# Patient Record
Sex: Female | Born: 1970 | Race: White | Hispanic: No | Marital: Single | Smoking: Current every day smoker
Health system: Southern US, Community
[De-identification: ages and names within clinical notes are randomized; demographics above are authoritative.]

## PROBLEM LIST (undated history)

## (undated) DIAGNOSIS — K219 Gastro-esophageal reflux disease without esophagitis: Secondary | ICD-10-CM

## (undated) DIAGNOSIS — N200 Calculus of kidney: Secondary | ICD-10-CM

## (undated) HISTORY — PX: PILONIDAL CYST EXCISION: SHX744

## (undated) HISTORY — PX: ABDOMINAL HYSTERECTOMY: SHX81

## (undated) HISTORY — PX: LITHOTRIPSY: SUR834

## (undated) HISTORY — PX: BREAST REDUCTION SURGERY: SHX8

---

## 2001-01-15 HISTORY — PX: ABDOMINAL HYSTERECTOMY: SHX81

## 2003-10-27 ENCOUNTER — Emergency Department: Payer: Self-pay | Admitting: General Practice

## 2003-11-23 ENCOUNTER — Emergency Department: Payer: Self-pay | Admitting: Emergency Medicine

## 2004-01-13 ENCOUNTER — Emergency Department: Payer: Self-pay | Admitting: Emergency Medicine

## 2004-02-24 ENCOUNTER — Emergency Department: Payer: Self-pay | Admitting: General Practice

## 2004-04-25 ENCOUNTER — Emergency Department: Payer: Self-pay | Admitting: Emergency Medicine

## 2004-11-21 ENCOUNTER — Emergency Department: Payer: Self-pay | Admitting: Unknown Physician Specialty

## 2005-06-06 ENCOUNTER — Other Ambulatory Visit: Payer: Self-pay

## 2005-06-06 ENCOUNTER — Emergency Department: Payer: Self-pay | Admitting: Emergency Medicine

## 2005-08-25 ENCOUNTER — Observation Stay: Payer: Self-pay | Admitting: Urology

## 2005-11-21 ENCOUNTER — Inpatient Hospital Stay: Payer: Self-pay | Admitting: Urology

## 2005-11-24 ENCOUNTER — Emergency Department: Payer: Self-pay | Admitting: General Practice

## 2005-11-29 ENCOUNTER — Inpatient Hospital Stay: Payer: Self-pay | Admitting: Internal Medicine

## 2006-01-27 ENCOUNTER — Emergency Department: Payer: Self-pay | Admitting: Emergency Medicine

## 2006-05-01 ENCOUNTER — Ambulatory Visit: Payer: Self-pay

## 2006-05-06 ENCOUNTER — Ambulatory Visit: Payer: Self-pay

## 2006-05-09 ENCOUNTER — Inpatient Hospital Stay: Payer: Self-pay

## 2006-10-16 ENCOUNTER — Emergency Department: Payer: Self-pay

## 2007-01-29 ENCOUNTER — Emergency Department: Payer: Self-pay | Admitting: Emergency Medicine

## 2007-01-31 ENCOUNTER — Emergency Department: Payer: Self-pay | Admitting: Emergency Medicine

## 2007-02-07 ENCOUNTER — Inpatient Hospital Stay: Payer: Self-pay | Admitting: Internal Medicine

## 2007-06-23 ENCOUNTER — Observation Stay: Payer: Self-pay | Admitting: Internal Medicine

## 2007-08-04 ENCOUNTER — Other Ambulatory Visit: Payer: Self-pay

## 2007-08-04 ENCOUNTER — Emergency Department: Payer: Self-pay | Admitting: Emergency Medicine

## 2007-08-13 ENCOUNTER — Emergency Department: Payer: Self-pay | Admitting: Emergency Medicine

## 2007-11-06 ENCOUNTER — Emergency Department: Payer: Self-pay | Admitting: Emergency Medicine

## 2008-01-10 IMAGING — CT CT ABD-PELV W/O CM
1 of 2 series · 16 of 32 positions shown, 20 images · non-contrast
Comparison: none

REASON FOR EXAM: Renal colic
COMMENTS:

[Series 2: stone · axial · 0.62mm/px · z∈[-164,+241]mm · 16 of 152 slices shown, 20 images]
[im 11/152  soft-tissue]
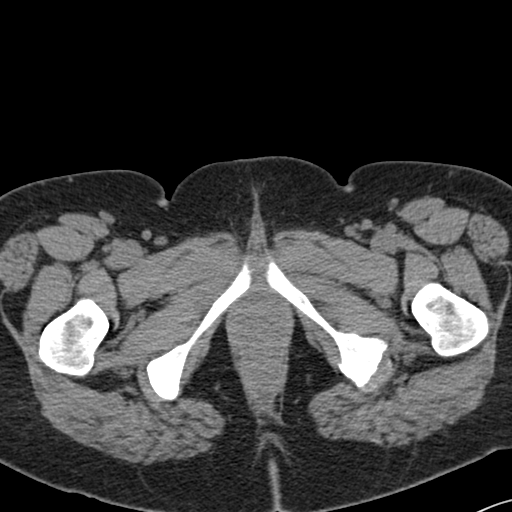
[im 11/152  bone]
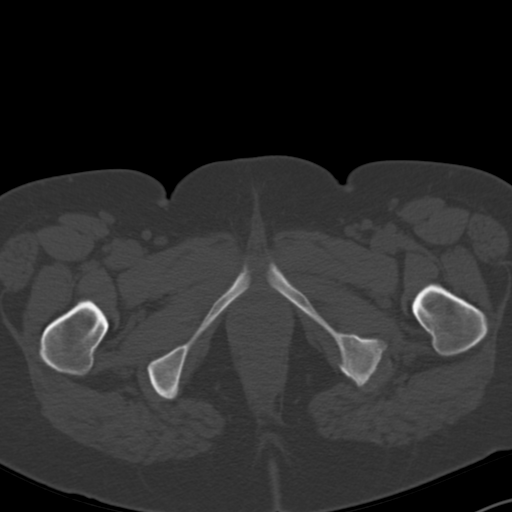
[im 21/152  soft-tissue]
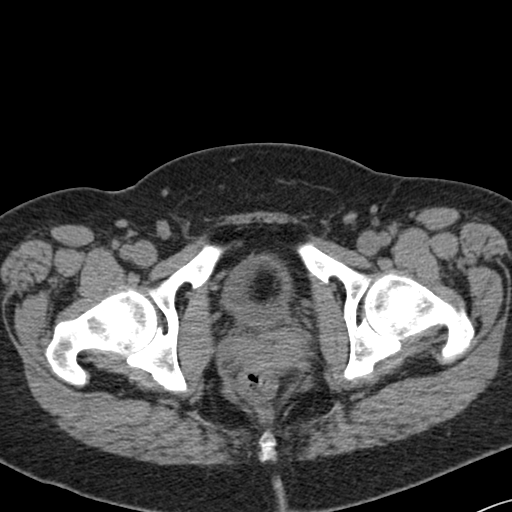
[im 32/152  soft-tissue]
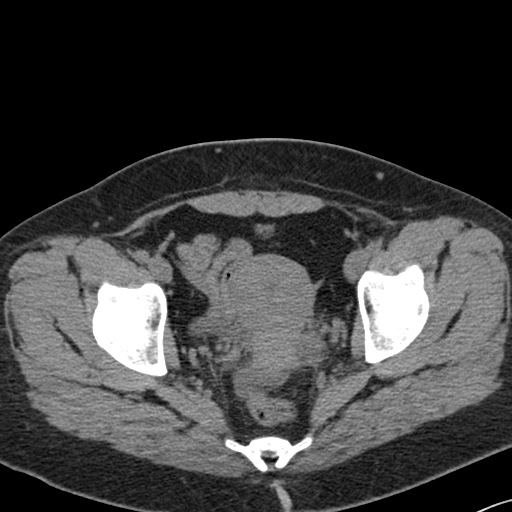
[im 42/152  soft-tissue]
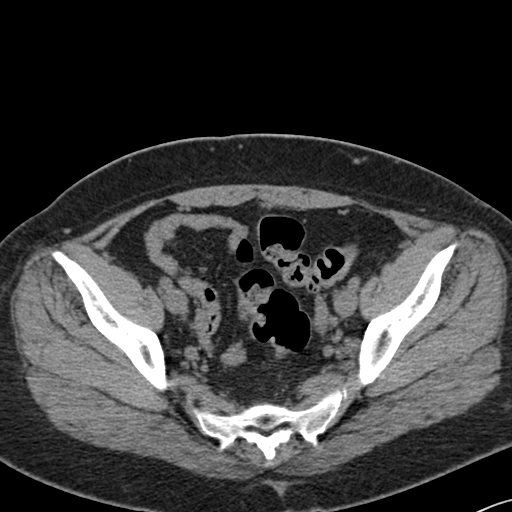
[im 53/152  soft-tissue]
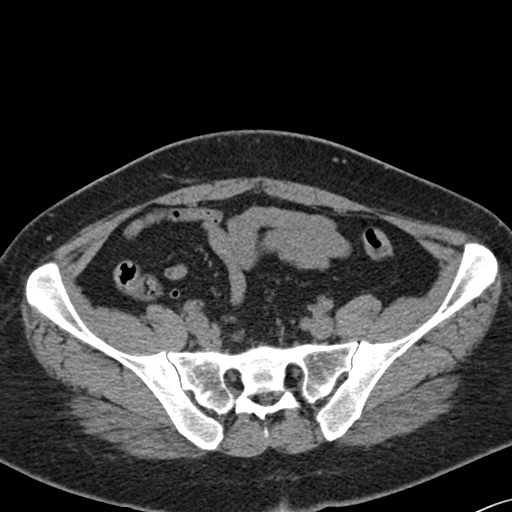
[im 63/152  soft-tissue]
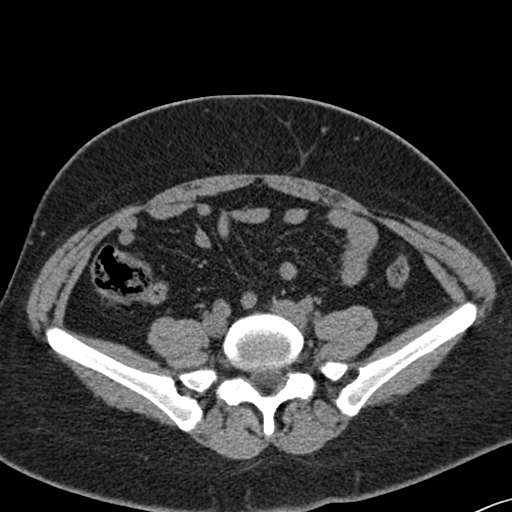
[im 73/152  soft-tissue]
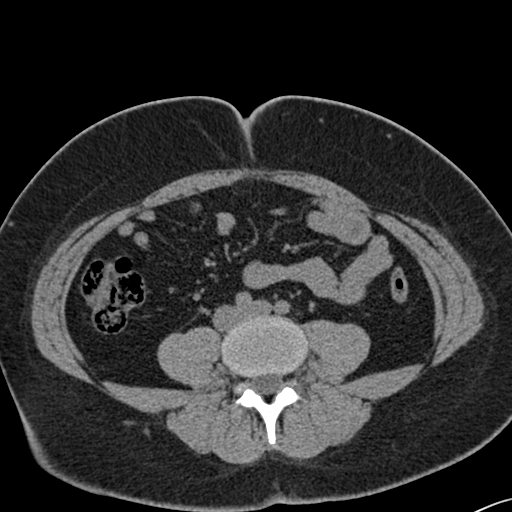
[im 84/152  soft-tissue]
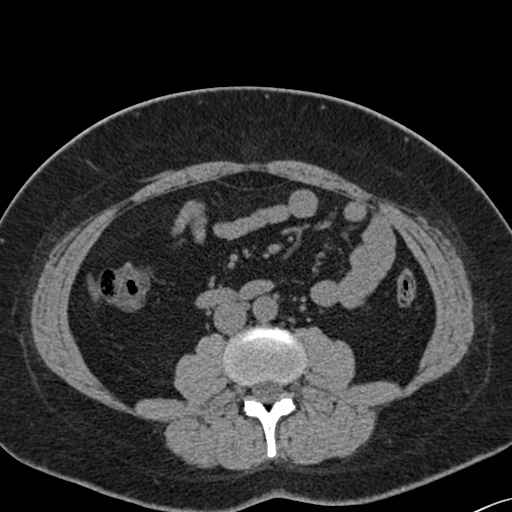
[im 94/152  soft-tissue]
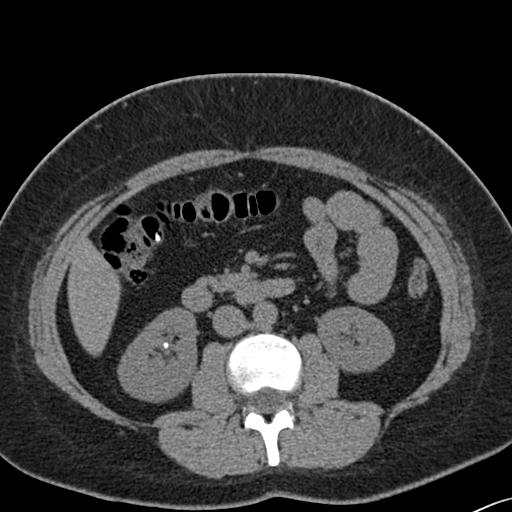
[im 94/152  bone]
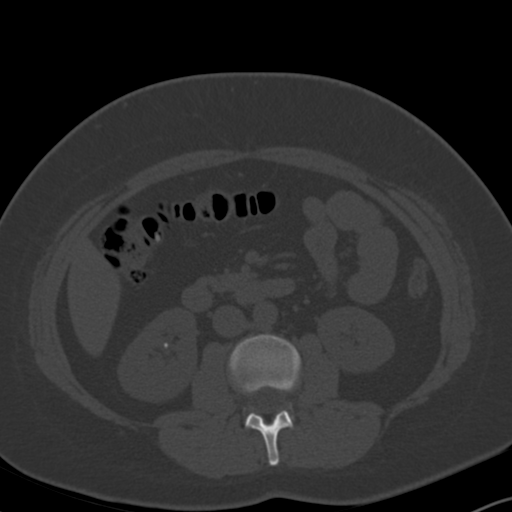
[im 105/152  soft-tissue]
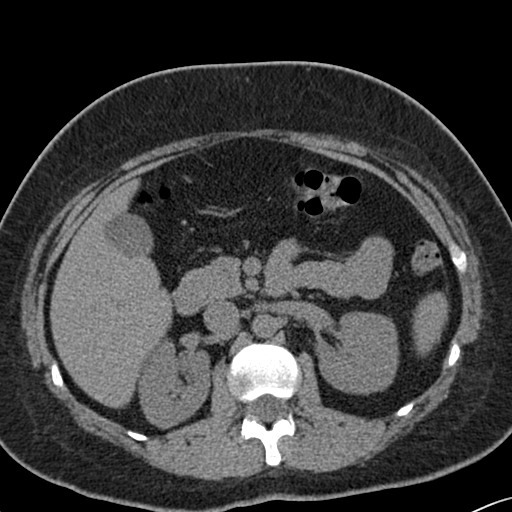
[im 115/152  soft-tissue]
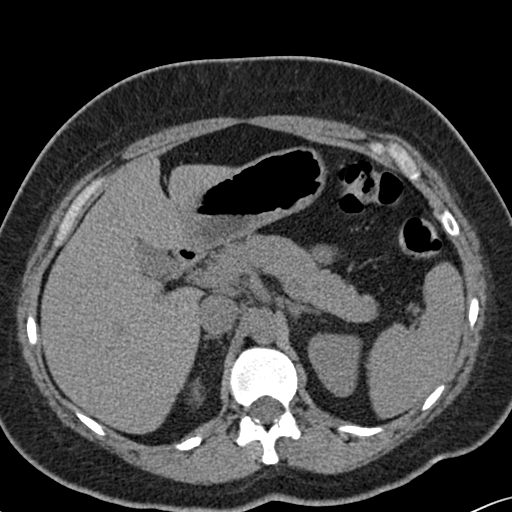
[im 125/152  soft-tissue]
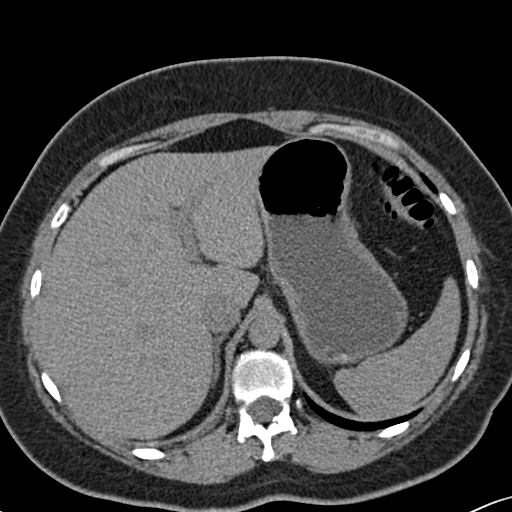
[im 131/152  lung]
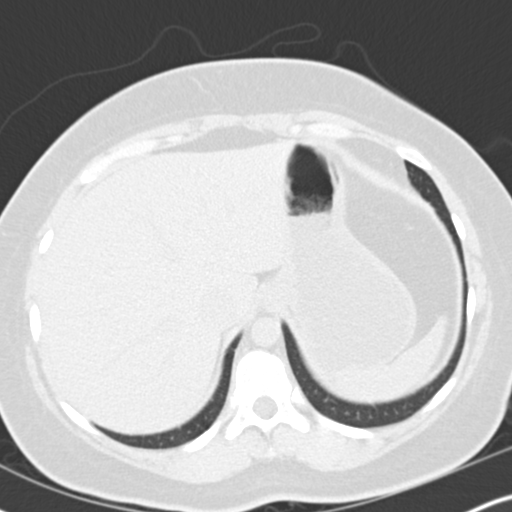
[im 136/152  soft-tissue]
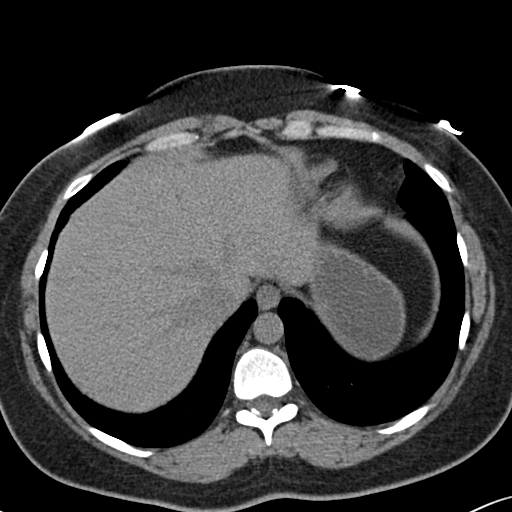
[im 136/152  lung]
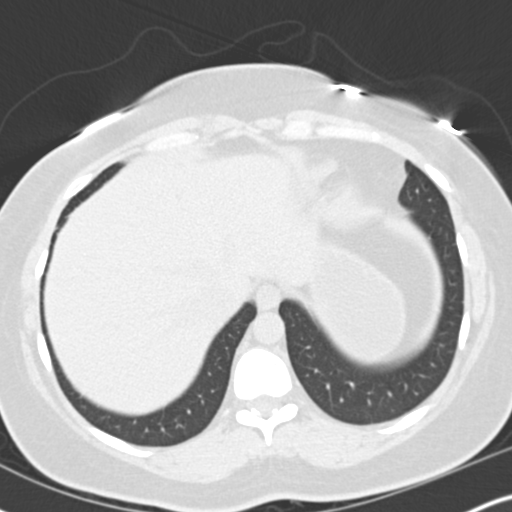
[im 141/152  lung]
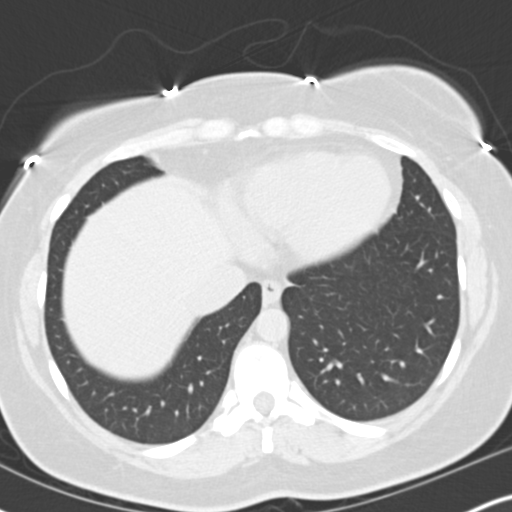
[im 146/152  soft-tissue]
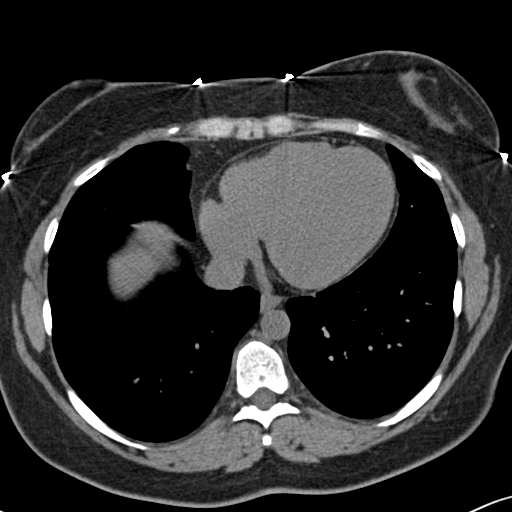
[im 146/152  lung]
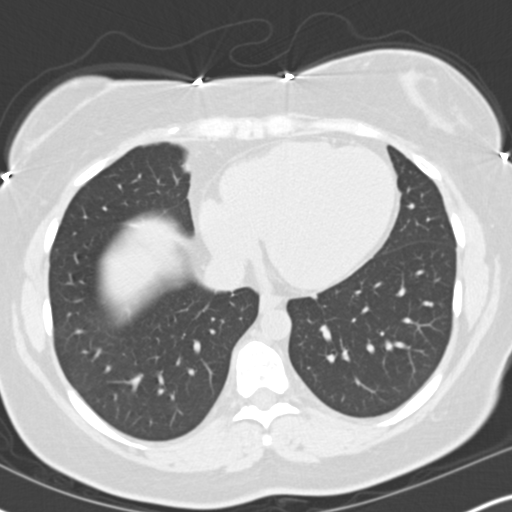

[16 of 32 positions shown; findings below may reference images not displayed]

PROCEDURE:     CT  - CT ABDOMEN AND PELVIS W[DATE] [DATE]

RESULT:     Noncontrast CT scan of the abdomen and pelvis is performed.  The
patient has prior study dated 08/25/05.  In comparison to the study of
08/25/05, the RIGHT UVJ stone is not demonstrated on today's exam.  The
uterus is present.  There are additional stable stones in the lower pole
collecting system of the RIGHT kidney and in the midpole region of the RIGHT
kidney posteromedially.  There is no definite hydronephrosis.  No
parenchymal calcification is evident.  The aorta is normal in caliber.  No
radiopaque gallstones are seen.  No free fluid or free air is evident.  No
abnormal bowel distention is seen.  No inflammatory changes are evident. The
lung bases are clear.
IMPRESSION: Persistent RIGHT renal calculi.  No obstruction.  No new stones seen.

The RIGHT UVJ stone seen previously is not evident.

## 2008-01-10 IMAGING — CR DG ABDOMEN 1V
1 series · 2 of 2 positions shown · non-contrast
Comparison: none

REASON FOR EXAM: Renal colic
COMMENTS:

[Series 1: view not recorded · 0.17mm/px · 2 of 2 slices shown]
[im 1/2]
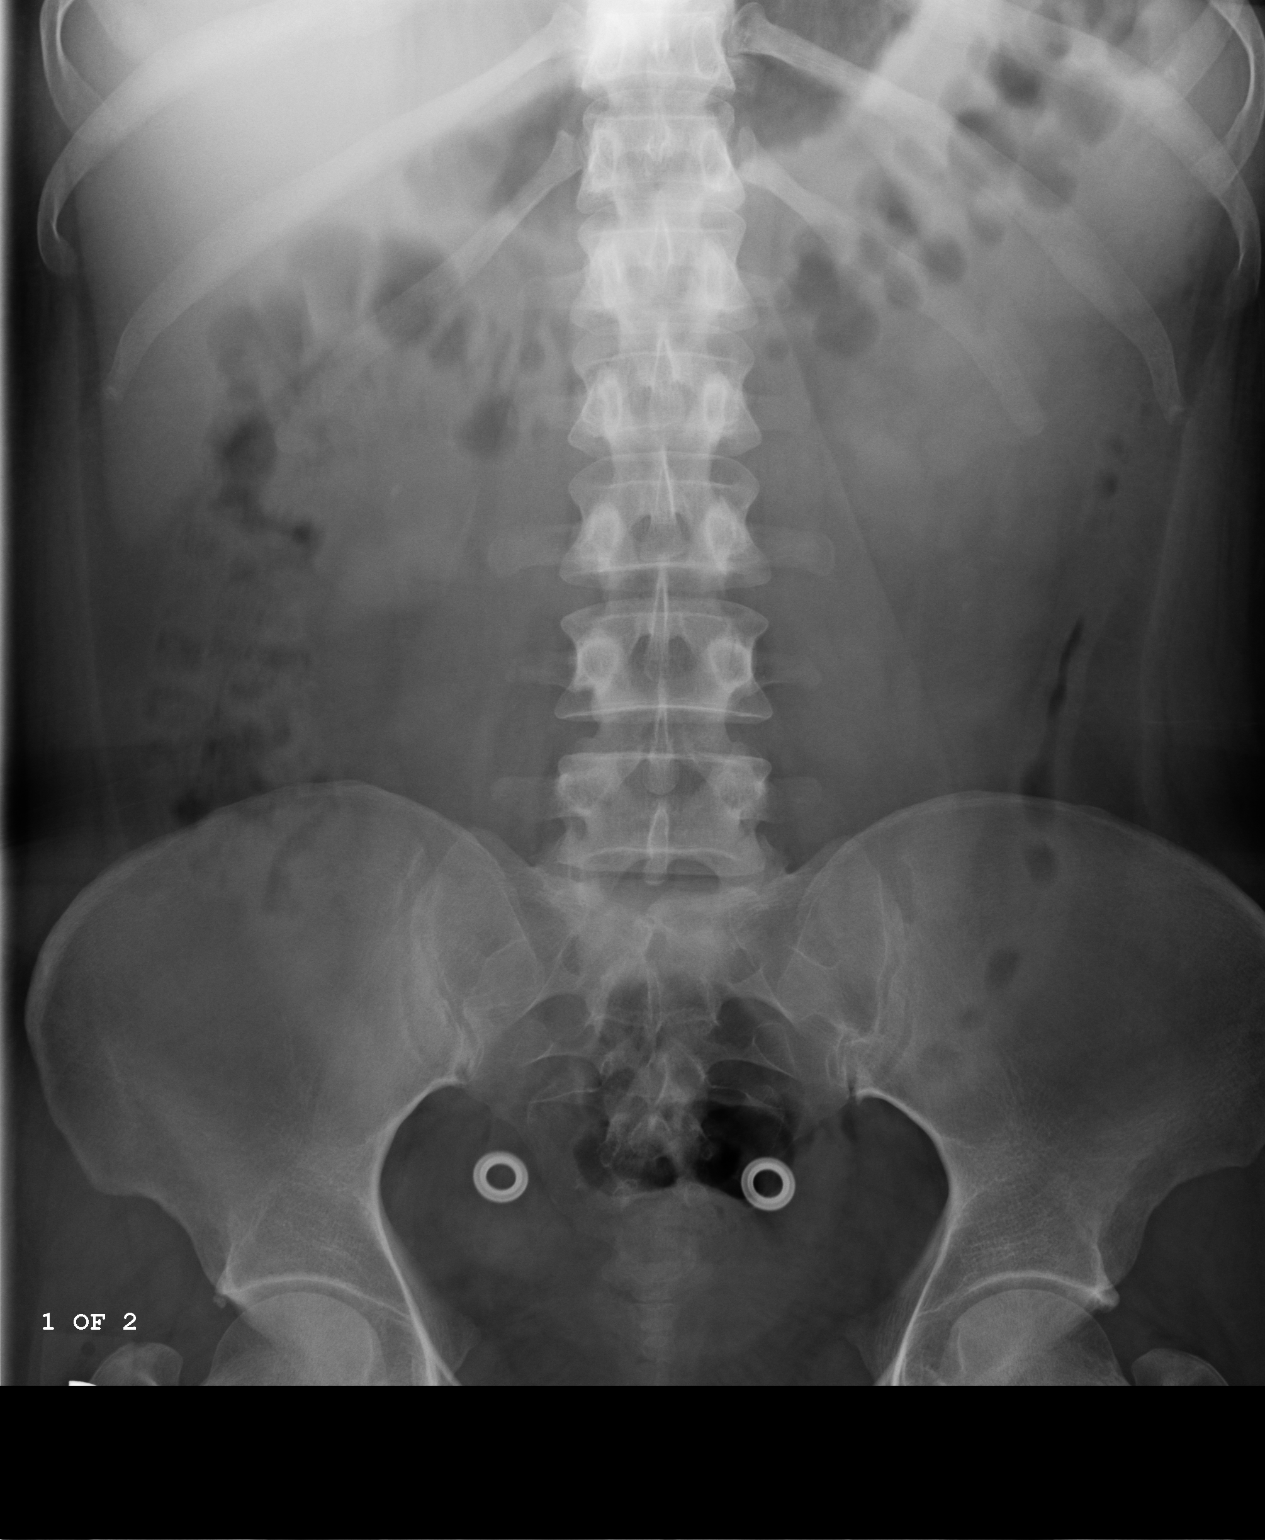
[im 2/2]
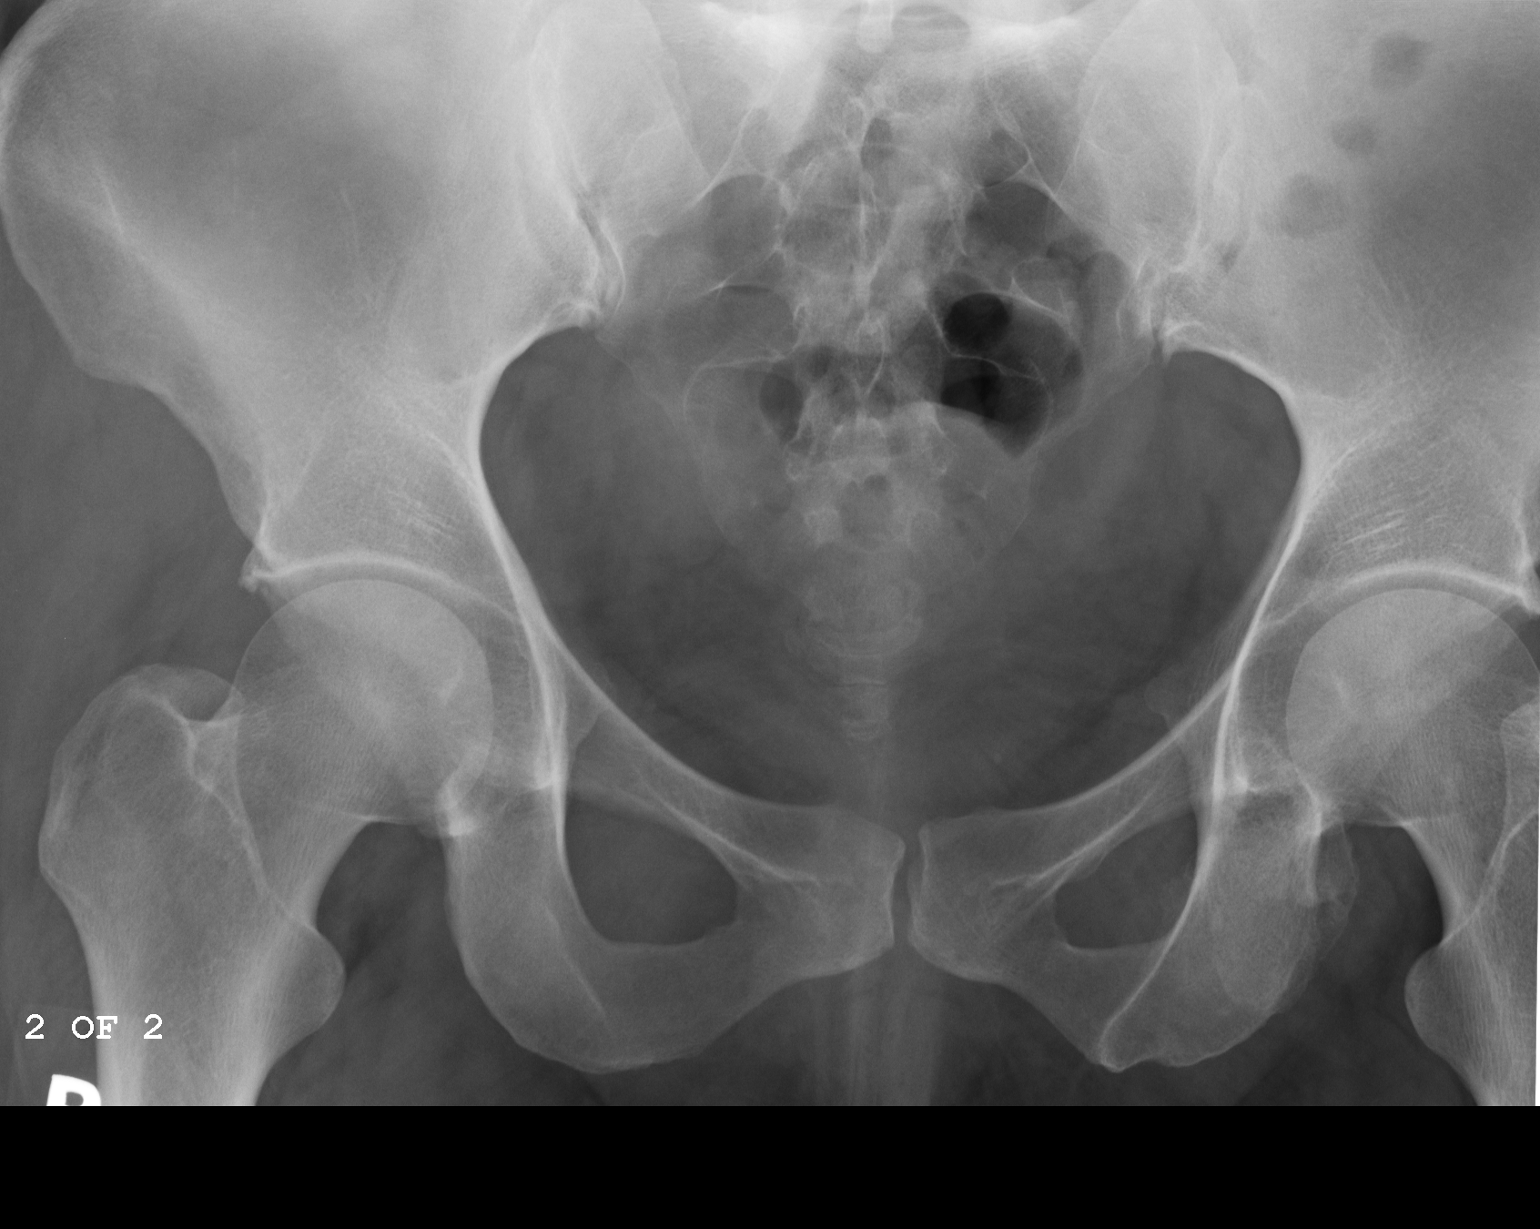

[2 of 2 positions shown; findings below may reference images not displayed]

PROCEDURE:     DXR - DXR KIDNEY URETER BLADDER  - November 21, 2005 [DATE]

RESULT:       AP view of the abdomen shows a tiny 3-4 mm calcification
projected over the lower pole of the RIGHT kidney and a possible second tiny
calcification in the lower pole of the RIGHT kidney.  No definite renal
calcifications are seen on the LEFT.  No ureteral calcifications are
identified.
IMPRESSION: Please see above.

## 2008-01-12 IMAGING — US ABDOMEN ULTRASOUND
1 series · 17 of 25 positions shown · non-contrast
Comparison: none

REASON FOR EXAM: Right upper quadrant abd pain
COMMENTS:

[Series 1: abdomen ultrasound · 17 of 46 slices shown]
[im 1/46]
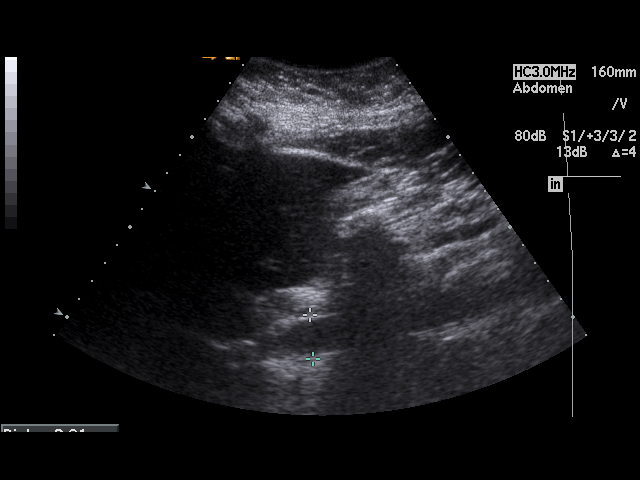
[im 4/46]
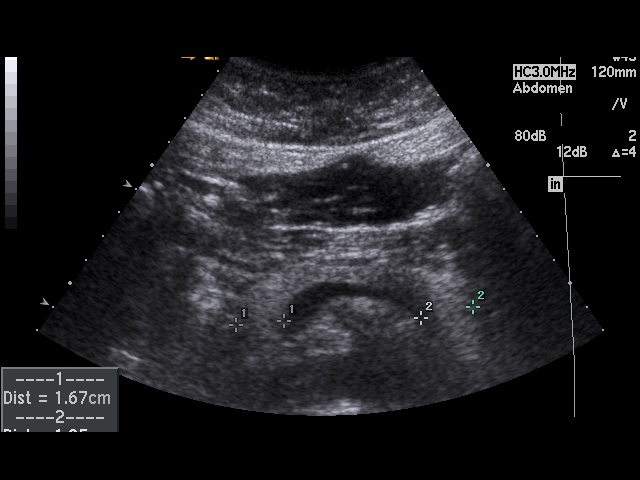
[im 6/46]
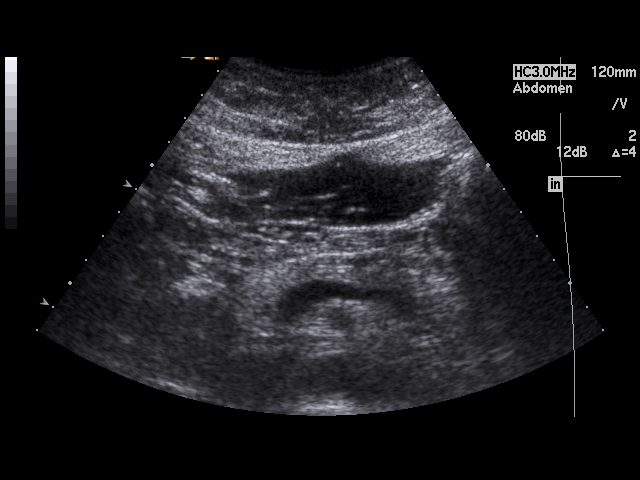
[im 10/46]
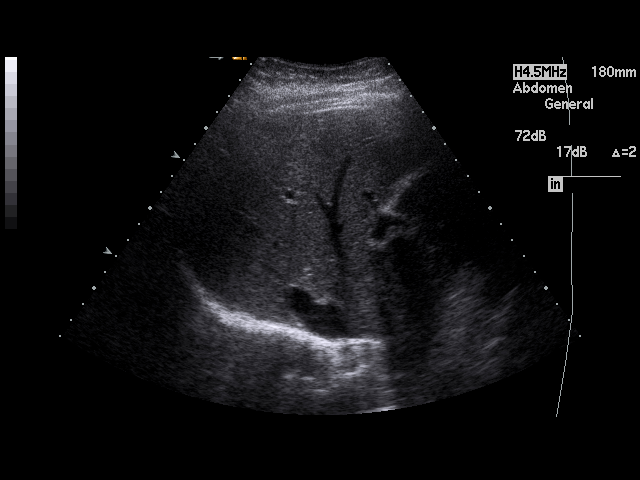
[im 12/46]
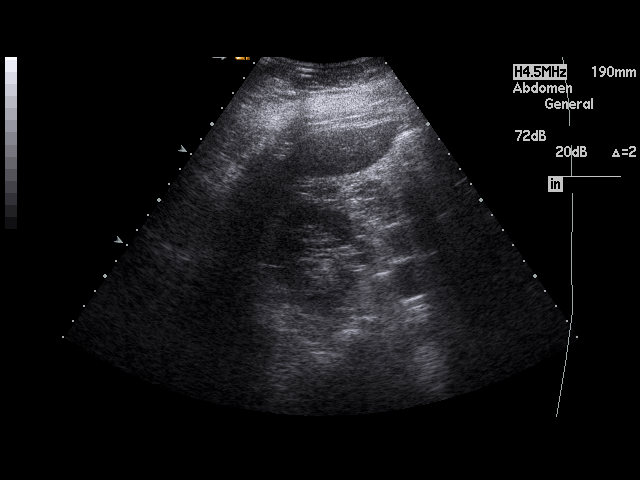
[im 16/46]
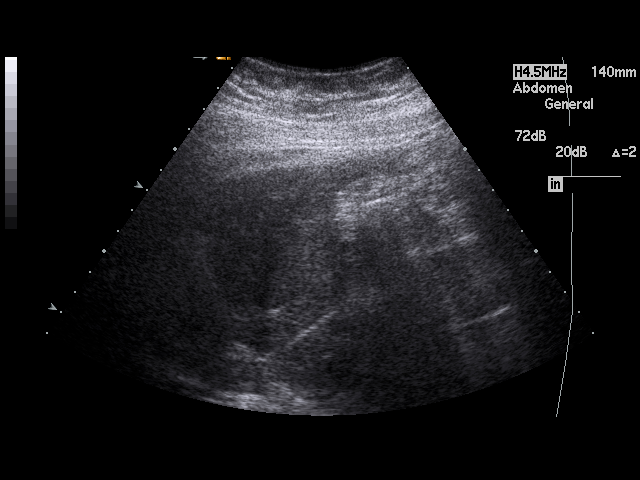
[im 17/46]
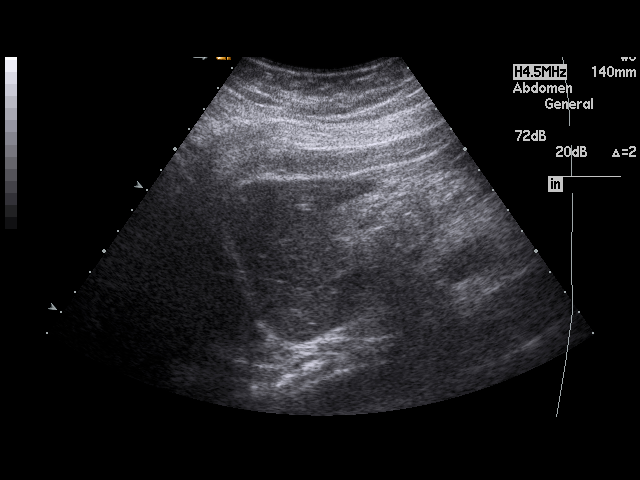
[im 21/46]
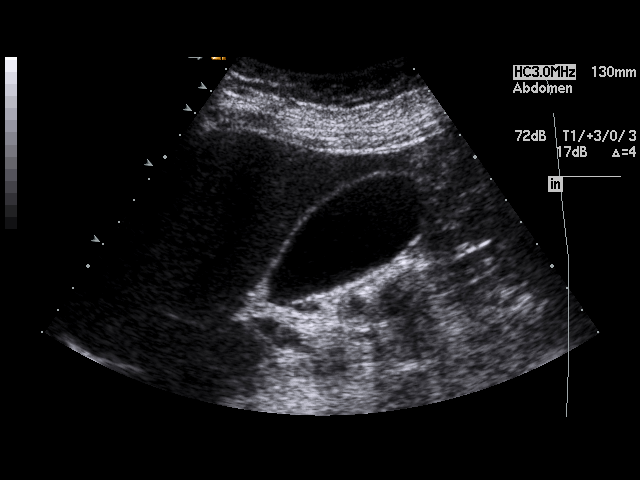
[im 23/46]
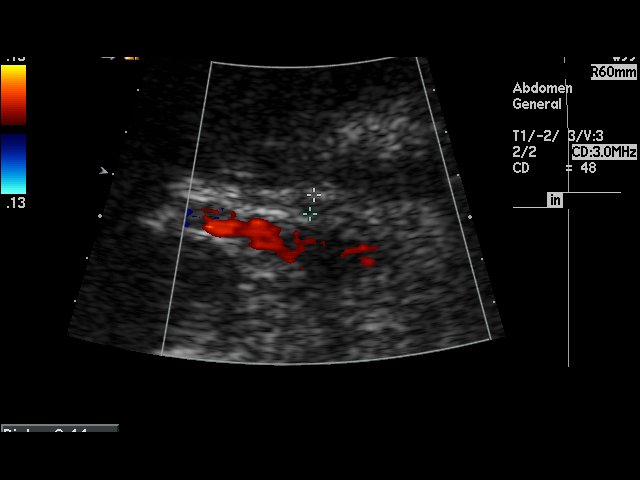
[im 25/46]
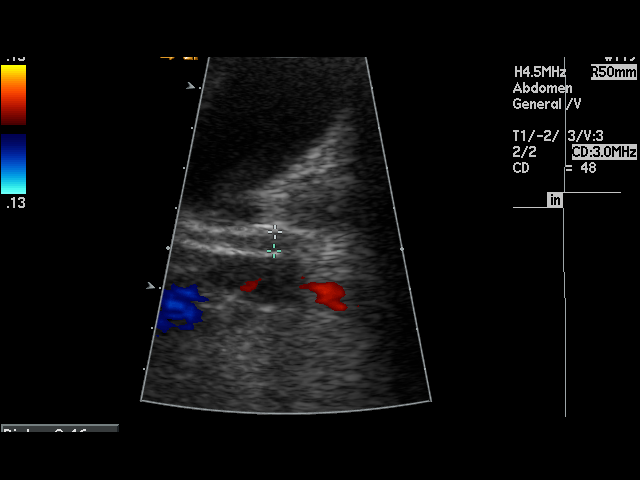
[im 29/46]
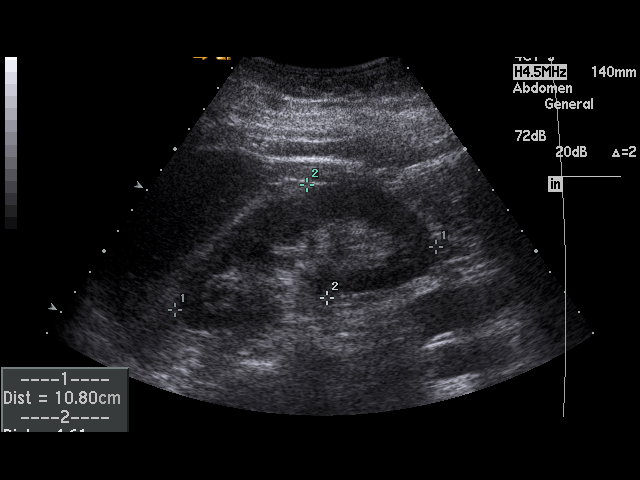
[im 31/46]
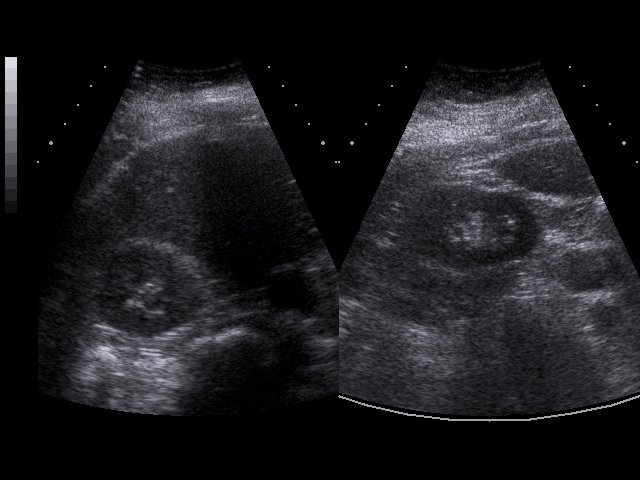
[im 34/46]
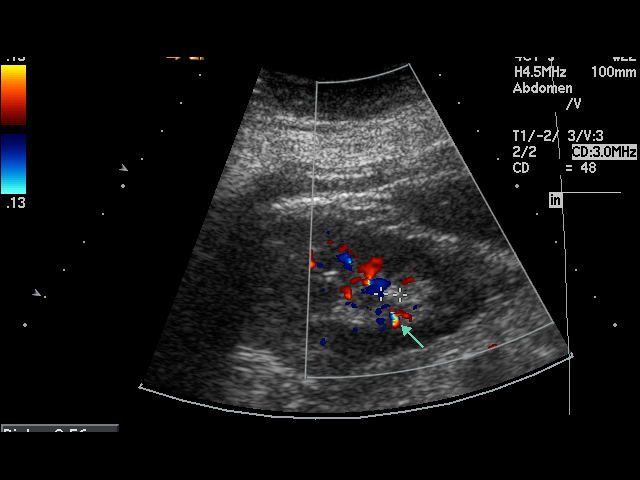
[im 36/46]
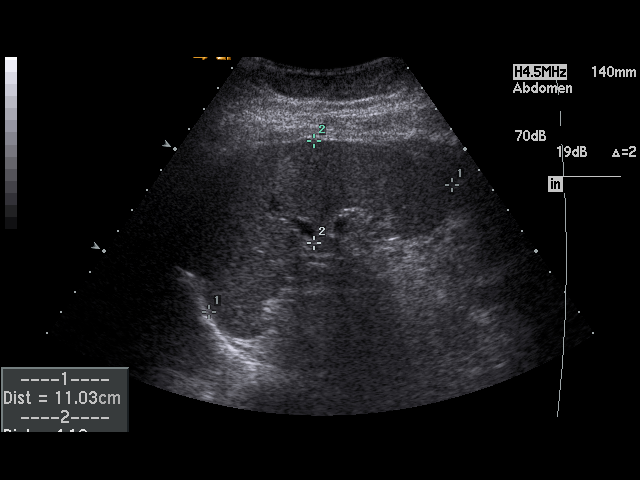
[im 40/46]
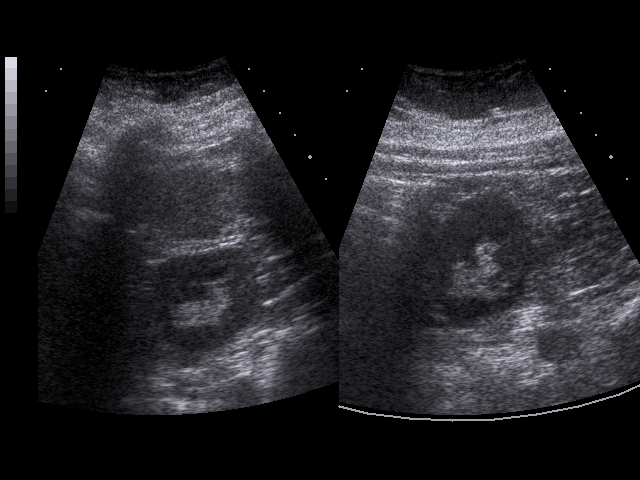
[im 42/46]
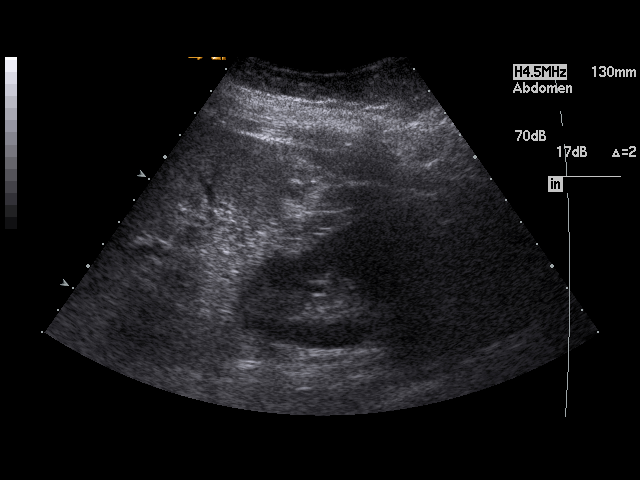
[im 46/46]
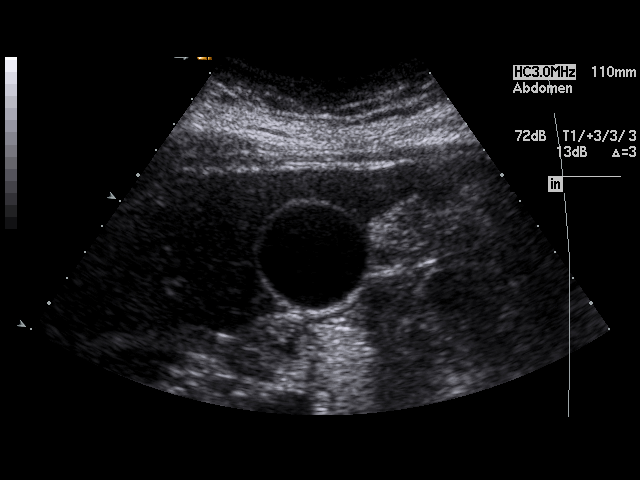

[17 of 25 positions shown; findings below may reference images not displayed]

PROCEDURE:     US  - US ABDOMEN GENERAL SURVEY  - November 23, 2005  [DATE]

RESULT:        The liver, spleen and pancreas are normal in appearance.  No
gallstones are seen. There is no thickening of the gallbladder wall.  The
common bile duct measures 4.6 mm in diameter which is within normal limits.
The kidneys show no hydronephrosis.  There is a 6.7 mm echo density in the
midpole region of the RIGHT kidney compatible with a renal stone.
Additional RIGHT renal stones were noted at prior CT.  No LEFT renal stones
are seen.  No ascites is noted.  There is a trace RIGHT pleural effusion.
The abdominal aorta is normal in appearance.
IMPRESSION: 1.     No gallstones are seen.
2.     There is observed a RIGHT renal calcification as mentioned above.
Additional calcifications were reported on prior CT.
3.     Trace RIGHT pleural effusion.
4.     There is no ascites.
5.     No hydronephrosis is seen.

## 2008-01-20 ENCOUNTER — Emergency Department: Payer: Self-pay | Admitting: Emergency Medicine

## 2008-03-08 ENCOUNTER — Emergency Department: Payer: Self-pay

## 2008-03-10 ENCOUNTER — Ambulatory Visit: Payer: Self-pay

## 2008-03-22 ENCOUNTER — Ambulatory Visit: Payer: Self-pay

## 2008-04-22 ENCOUNTER — Inpatient Hospital Stay: Payer: Self-pay | Admitting: Internal Medicine

## 2008-08-12 ENCOUNTER — Emergency Department: Payer: Self-pay | Admitting: Unknown Physician Specialty

## 2009-01-17 ENCOUNTER — Emergency Department: Payer: Self-pay | Admitting: Emergency Medicine

## 2009-07-10 ENCOUNTER — Observation Stay: Payer: Self-pay | Admitting: Urology

## 2009-07-13 ENCOUNTER — Emergency Department: Payer: Self-pay | Admitting: Emergency Medicine

## 2010-03-21 ENCOUNTER — Emergency Department: Payer: Self-pay | Admitting: Emergency Medicine

## 2010-07-25 ENCOUNTER — Emergency Department: Payer: Self-pay | Admitting: Internal Medicine

## 2010-09-25 ENCOUNTER — Emergency Department: Payer: Self-pay | Admitting: Emergency Medicine

## 2010-11-06 ENCOUNTER — Emergency Department: Payer: Self-pay | Admitting: *Deleted

## 2011-02-01 ENCOUNTER — Emergency Department: Payer: Self-pay | Admitting: Emergency Medicine

## 2011-02-01 LAB — URINALYSIS, COMPLETE
Bacteria: NONE SEEN
Blood: NEGATIVE
Glucose,UR: NEGATIVE mg/dL (ref 0–75)
Leukocyte Esterase: NEGATIVE
Nitrite: NEGATIVE
Ph: 5 (ref 4.5–8.0)
Protein: NEGATIVE
RBC,UR: 1 /HPF (ref 0–5)
Specific Gravity: 1.025 (ref 1.003–1.030)

## 2011-02-01 LAB — CBC
HGB: 15.2 g/dL (ref 12.0–16.0)
MCH: 32.2 pg (ref 26.0–34.0)
MCHC: 34.5 g/dL (ref 32.0–36.0)
MCV: 93 fL (ref 80–100)
Platelet: 228 10*3/uL (ref 150–440)
RBC: 4.72 10*6/uL (ref 3.80–5.20)
RDW: 12.5 % (ref 11.5–14.5)

## 2011-02-01 LAB — COMPREHENSIVE METABOLIC PANEL
Albumin: 4.3 g/dL (ref 3.4–5.0)
Alkaline Phosphatase: 42 U/L — ABNORMAL LOW (ref 50–136)
Anion Gap: 11 (ref 7–16)
Calcium, Total: 9.3 mg/dL (ref 8.5–10.1)
EGFR (Non-African Amer.): >60
Glucose: 98 mg/dL (ref 65–99)
Potassium: 4.1 mmol/L (ref 3.5–5.1)
SGOT(AST): 18 U/L (ref 15–37)
SGPT (ALT): 21 U/L
Total Protein: 7.9 g/dL (ref 6.4–8.2)

## 2011-02-01 LAB — LIPASE, BLOOD: Lipase: 72 U/L — ABNORMAL LOW (ref 73–393)

## 2011-06-18 ENCOUNTER — Emergency Department: Payer: Self-pay | Admitting: *Deleted

## 2011-06-18 LAB — COMPREHENSIVE METABOLIC PANEL
Alkaline Phosphatase: 45 U/L — ABNORMAL LOW (ref 50–136)
BUN: 12 mg/dL (ref 7–18)
Calcium, Total: 8.6 mg/dL (ref 8.5–10.1)
EGFR (African American): >60
EGFR (Non-African Amer.): >60
Glucose: 103 mg/dL — ABNORMAL HIGH (ref 65–99)
Potassium: 3.6 mmol/L (ref 3.5–5.1)
SGPT (ALT): 20 U/L
Sodium: 140 mmol/L (ref 136–145)
Total Protein: 7 g/dL (ref 6.4–8.2)

## 2011-06-18 LAB — CBC WITH DIFFERENTIAL/PLATELET
Basophil %: 0.9 %
Eosinophil #: 0.4 10*3/uL (ref 0.0–0.7)
HCT: 41.4 % (ref 35.0–47.0)
Lymphocyte #: 3.3 10*3/uL (ref 1.0–3.6)
MCH: 32.5 pg (ref 26.0–34.0)
MCHC: 34.2 g/dL (ref 32.0–36.0)
MCV: 95 fL (ref 80–100)
Monocyte %: 7.1 %
Neutrophil #: 5.5 10*3/uL (ref 1.4–6.5)
Neutrophil %: 55.1 %
Platelet: 226 10*3/uL (ref 150–440)
RBC: 4.37 10*6/uL (ref 3.80–5.20)

## 2011-06-18 LAB — LIPASE, BLOOD: Lipase: 73 U/L (ref 73–393)

## 2011-12-31 ENCOUNTER — Emergency Department: Payer: Self-pay | Admitting: Emergency Medicine

## 2011-12-31 LAB — CBC
HGB: 15.2 g/dL (ref 12.0–16.0)
MCH: 33.8 pg (ref 26.0–34.0)
MCHC: 35.3 g/dL (ref 32.0–36.0)
Platelet: 249 10*3/uL (ref 150–440)
RBC: 4.51 10*6/uL (ref 3.80–5.20)
RDW: 12.5 % (ref 11.5–14.5)

## 2011-12-31 LAB — COMPREHENSIVE METABOLIC PANEL
Albumin: 3.9 g/dL (ref 3.4–5.0)
Anion Gap: 8 (ref 7–16)
BUN: 16 mg/dL (ref 7–18)
Calcium, Total: 9 mg/dL (ref 8.5–10.1)
Creatinine: 0.72 mg/dL (ref 0.60–1.30)
Glucose: 111 mg/dL — ABNORMAL HIGH (ref 65–99)
Potassium: 3.8 mmol/L (ref 3.5–5.1)
SGOT(AST): 20 U/L (ref 15–37)
SGPT (ALT): 22 U/L (ref 12–78)
Total Protein: 7.1 g/dL (ref 6.4–8.2)

## 2011-12-31 LAB — LIPASE, BLOOD: Lipase: 130 U/L (ref 73–393)

## 2012-01-22 ENCOUNTER — Emergency Department: Payer: Self-pay | Admitting: Emergency Medicine

## 2012-11-11 ENCOUNTER — Emergency Department: Payer: Self-pay | Admitting: Emergency Medicine

## 2012-11-11 LAB — BASIC METABOLIC PANEL
Anion Gap: 10 (ref 7–16)
BUN: 11 mg/dL (ref 7–18)
Calcium, Total: 9 mg/dL (ref 8.5–10.1)
Co2: 19 mmol/L — ABNORMAL LOW (ref 21–32)
Creatinine: 0.85 mg/dL (ref 0.60–1.30)
EGFR (African American): >60

## 2012-11-11 LAB — CBC
HGB: 14.9 g/dL (ref 12.0–16.0)
MCH: 31.9 pg (ref 26.0–34.0)
MCHC: 34.3 g/dL (ref 32.0–36.0)
MCV: 93 fL (ref 80–100)
Platelet: 265 10*3/uL (ref 150–440)
RBC: 4.67 10*6/uL (ref 3.80–5.20)
RDW: 12.9 % (ref 11.5–14.5)
WBC: 20.1 10*3/uL — ABNORMAL HIGH (ref 3.6–11.0)

## 2013-02-04 ENCOUNTER — Emergency Department: Payer: Self-pay | Admitting: Emergency Medicine

## 2013-02-04 LAB — COMPREHENSIVE METABOLIC PANEL
ALBUMIN: 3.8 g/dL (ref 3.4–5.0)
ALK PHOS: 50 U/L
ALT: 19 U/L (ref 12–78)
AST: 20 U/L (ref 15–37)
Anion Gap: 8 (ref 7–16)
BUN: 10 mg/dL (ref 7–18)
Bilirubin,Total: 0.3 mg/dL (ref 0.2–1.0)
CO2: 19 mmol/L — AB (ref 21–32)
CREATININE: 0.78 mg/dL (ref 0.60–1.30)
Calcium, Total: 8.8 mg/dL (ref 8.5–10.1)
Chloride: 110 mmol/L — ABNORMAL HIGH (ref 98–107)
EGFR (Non-African Amer.): >60
Glucose: 107 mg/dL — ABNORMAL HIGH (ref 65–99)
OSMOLALITY: 273 (ref 275–301)
Potassium: 4 mmol/L (ref 3.5–5.1)
Sodium: 137 mmol/L (ref 136–145)
TOTAL PROTEIN: 7 g/dL (ref 6.4–8.2)

## 2013-02-04 LAB — URINALYSIS, COMPLETE
BACTERIA: NONE SEEN
BILIRUBIN, UR: NEGATIVE
Blood: NEGATIVE
Glucose,UR: NEGATIVE mg/dL (ref 0–75)
Ketone: NEGATIVE
Leukocyte Esterase: NEGATIVE
Nitrite: NEGATIVE
Ph: 5 (ref 4.5–8.0)
Protein: NEGATIVE
Specific Gravity: 1.018 (ref 1.003–1.030)
WBC UR: 1 /HPF (ref 0–5)

## 2013-02-04 LAB — CBC WITH DIFFERENTIAL/PLATELET
BASOS ABS: 0.1 10*3/uL (ref 0.0–0.1)
Basophil %: 0.7 %
EOS ABS: 0.3 10*3/uL (ref 0.0–0.7)
EOS PCT: 3.6 %
HCT: 43.9 % (ref 35.0–47.0)
HGB: 14.7 g/dL (ref 12.0–16.0)
LYMPHS PCT: 35.1 %
Lymphocyte #: 3.3 10*3/uL (ref 1.0–3.6)
MCH: 32.2 pg (ref 26.0–34.0)
MCHC: 33.4 g/dL (ref 32.0–36.0)
MCV: 97 fL (ref 80–100)
MONO ABS: 0.7 x10 3/mm (ref 0.2–0.9)
Monocyte %: 7 %
NEUTROS ABS: 5 10*3/uL (ref 1.4–6.5)
Neutrophil %: 53.6 %
Platelet: 235 10*3/uL (ref 150–440)
RBC: 4.55 10*6/uL (ref 3.80–5.20)
RDW: 12.8 % (ref 11.5–14.5)
WBC: 9.3 10*3/uL (ref 3.6–11.0)

## 2013-02-04 LAB — LIPASE, BLOOD: Lipase: 100 U/L (ref 73–393)

## 2013-02-04 LAB — TROPONIN I: Troponin-I: <0.02 ng/mL

## 2013-07-20 ENCOUNTER — Emergency Department: Payer: Self-pay | Admitting: Emergency Medicine

## 2013-07-20 LAB — CBC WITH DIFFERENTIAL/PLATELET
BASOS PCT: 1.2 %
Basophil #: 0.2 10*3/uL — ABNORMAL HIGH (ref 0.0–0.1)
EOS PCT: 3.3 %
Eosinophil #: 0.4 10*3/uL (ref 0.0–0.7)
HCT: 44.2 % (ref 35.0–47.0)
HGB: 15 g/dL (ref 12.0–16.0)
Lymphocyte #: 3.8 10*3/uL — ABNORMAL HIGH (ref 1.0–3.6)
Lymphocyte %: 30.7 %
MCH: 32.4 pg (ref 26.0–34.0)
MCHC: 33.9 g/dL (ref 32.0–36.0)
MCV: 96 fL (ref 80–100)
Monocyte #: 0.7 x10 3/mm (ref 0.2–0.9)
Monocyte %: 5.7 %
NEUTROS ABS: 7.3 10*3/uL — AB (ref 1.4–6.5)
Neutrophil %: 59.1 %
Platelet: 269 10*3/uL (ref 150–440)
RBC: 4.62 10*6/uL (ref 3.80–5.20)
RDW: 12.8 % (ref 11.5–14.5)
WBC: 12.4 10*3/uL — ABNORMAL HIGH (ref 3.6–11.0)

## 2013-07-20 LAB — COMPREHENSIVE METABOLIC PANEL
Albumin: 3.8 g/dL (ref 3.4–5.0)
Alkaline Phosphatase: 52 U/L
Anion Gap: 9 (ref 7–16)
BILIRUBIN TOTAL: 0.3 mg/dL (ref 0.2–1.0)
BUN: 11 mg/dL (ref 7–18)
CHLORIDE: 107 mmol/L (ref 98–107)
CREATININE: 0.83 mg/dL (ref 0.60–1.30)
Calcium, Total: 8.9 mg/dL (ref 8.5–10.1)
Co2: 22 mmol/L (ref 21–32)
EGFR (Non-African Amer.): >60
Glucose: 103 mg/dL — ABNORMAL HIGH (ref 65–99)
Osmolality: 275 (ref 275–301)
Potassium: 4 mmol/L (ref 3.5–5.1)
SGOT(AST): 24 U/L (ref 15–37)
SGPT (ALT): 21 U/L (ref 12–78)
Sodium: 138 mmol/L (ref 136–145)
Total Protein: 7.4 g/dL (ref 6.4–8.2)

## 2013-07-20 LAB — LIPASE, BLOOD: LIPASE: 103 U/L (ref 73–393)

## 2014-01-14 ENCOUNTER — Emergency Department: Payer: Self-pay | Admitting: Emergency Medicine

## 2014-01-14 LAB — COMPREHENSIVE METABOLIC PANEL
ALBUMIN: 3.6 g/dL (ref 3.4–5.0)
ALK PHOS: 57 U/L
AST: 39 U/L — AB (ref 15–37)
Anion Gap: 7 (ref 7–16)
BILIRUBIN TOTAL: 0.2 mg/dL (ref 0.2–1.0)
BUN: 14 mg/dL (ref 7–18)
CALCIUM: 8.7 mg/dL (ref 8.5–10.1)
CO2: 24 mmol/L (ref 21–32)
Chloride: 108 mmol/L — ABNORMAL HIGH (ref 98–107)
Creatinine: 0.85 mg/dL (ref 0.60–1.30)
EGFR (Non-African Amer.): >60
Glucose: 110 mg/dL — ABNORMAL HIGH (ref 65–99)
Osmolality: 279 (ref 275–301)
POTASSIUM: 4 mmol/L (ref 3.5–5.1)
SGPT (ALT): 39 U/L
Sodium: 139 mmol/L (ref 136–145)
Total Protein: 7.1 g/dL (ref 6.4–8.2)

## 2014-01-14 LAB — CBC WITH DIFFERENTIAL/PLATELET
Basophil #: 0 10*3/uL (ref 0.0–0.1)
Basophil %: 0.2 %
EOS ABS: 0.5 10*3/uL (ref 0.0–0.7)
Eosinophil %: 3.3 %
HCT: 42 % (ref 35.0–47.0)
HGB: 14.2 g/dL (ref 12.0–16.0)
Lymphocyte #: 3.8 10*3/uL — ABNORMAL HIGH (ref 1.0–3.6)
Lymphocyte %: 26.3 %
MCH: 32.9 pg (ref 26.0–34.0)
MCHC: 33.8 g/dL (ref 32.0–36.0)
MCV: 97 fL (ref 80–100)
MONO ABS: 0.8 x10 3/mm (ref 0.2–0.9)
MONOS PCT: 5.6 %
NEUTROS ABS: 9.3 10*3/uL — AB (ref 1.4–6.5)
Neutrophil %: 64.6 %
Platelet: 292 10*3/uL (ref 150–440)
RBC: 4.31 10*6/uL (ref 3.80–5.20)
RDW: 12 % (ref 11.5–14.5)
WBC: 14.4 10*3/uL — ABNORMAL HIGH (ref 3.6–11.0)

## 2014-01-14 LAB — URINALYSIS, COMPLETE
Bacteria: NONE SEEN
Bilirubin,UR: NEGATIVE
Blood: NEGATIVE
GLUCOSE, UR: NEGATIVE mg/dL (ref 0–75)
Ketone: NEGATIVE
LEUKOCYTE ESTERASE: NEGATIVE
Nitrite: NEGATIVE
PH: 6 (ref 4.5–8.0)
Protein: NEGATIVE
RBC, UR: NONE SEEN /HPF (ref 0–5)
Specific Gravity: 1.012 (ref 1.003–1.030)
WBC UR: 2 /HPF (ref 0–5)

## 2014-07-16 ENCOUNTER — Emergency Department: Payer: Self-pay

## 2014-07-16 ENCOUNTER — Encounter: Payer: Self-pay | Admitting: Radiology

## 2014-07-16 ENCOUNTER — Other Ambulatory Visit: Payer: Self-pay

## 2014-07-16 ENCOUNTER — Emergency Department
Admission: EM | Admit: 2014-07-16 | Discharge: 2014-07-16 | Disposition: A | Discharge status: Home / Self Care | Payer: Self-pay | Attending: Emergency Medicine | Admitting: Emergency Medicine

## 2014-07-16 DIAGNOSIS — R1013 Epigastric pain: Secondary | ICD-10-CM | POA: Insufficient documentation

## 2014-07-16 DIAGNOSIS — Z8711 Personal history of peptic ulcer disease: Secondary | ICD-10-CM | POA: Insufficient documentation

## 2014-07-16 LAB — CBC
HCT: 42.3 % (ref 35.0–47.0)
HEMOGLOBIN: 14.2 g/dL (ref 12.0–16.0)
MCH: 31.4 pg (ref 26.0–34.0)
MCHC: 33.5 g/dL (ref 32.0–36.0)
MCV: 93.6 fL (ref 80.0–100.0)
PLATELETS: 258 10*3/uL (ref 150–440)
RBC: 4.52 MIL/uL (ref 3.80–5.20)
RDW: 12.5 % (ref 11.5–14.5)
WBC: 8.9 10*3/uL (ref 3.6–11.0)

## 2014-07-16 LAB — COMPREHENSIVE METABOLIC PANEL
ALBUMIN: 4.2 g/dL (ref 3.5–5.0)
ALT: 19 U/L (ref 14–54)
AST: 20 U/L (ref 15–41)
Alkaline Phosphatase: 42 U/L (ref 38–126)
Anion gap: 8 (ref 5–15)
BUN: 13 mg/dL (ref 6–20)
CO2: 24 mmol/L (ref 22–32)
Calcium: 9 mg/dL (ref 8.9–10.3)
Chloride: 107 mmol/L (ref 101–111)
Creatinine, Ser: 0.81 mg/dL (ref 0.44–1.00)
GFR calc Af Amer: >60 mL/min (ref >60)
GFR calc non Af Amer: >60 mL/min (ref >60)
GLUCOSE: 110 mg/dL — AB (ref 65–99)
Potassium: 3.6 mmol/L (ref 3.5–5.1)
SODIUM: 139 mmol/L (ref 135–145)
Total Bilirubin: 0.4 mg/dL (ref 0.3–1.2)
Total Protein: 6.9 g/dL (ref 6.5–8.1)

## 2014-07-16 LAB — LIPASE, BLOOD: Lipase: 50 U/L (ref 22–51)

## 2014-07-16 MED ORDER — IOHEXOL 240 MG/ML SOLN
25.0000 mL | Freq: Once | INTRAMUSCULAR | Status: AC | PRN
  Administered 2014-07-16: 25 mL via ORAL

## 2014-07-16 MED ORDER — OMEPRAZOLE 40 MG PO CPDR: 40.0000 mg | DELAYED_RELEASE_CAPSULE | Freq: Every day | ORAL | Status: DC

## 2014-07-16 MED ORDER — MORPHINE SULFATE 4 MG/ML IJ SOLN
4.0000 mg | Freq: Once | INTRAMUSCULAR | Status: AC
  Administered 2014-07-16: 4 mg via INTRAVENOUS

## 2014-07-16 MED ORDER — GI COCKTAIL ~~LOC~~
30.0000 mL | Freq: Once | ORAL | Status: AC
  Administered 2014-07-16: 30 mL via ORAL

## 2014-07-16 MED ORDER — MORPHINE SULFATE 4 MG/ML IJ SOLN
INTRAMUSCULAR | Status: AC
  Administered 2014-07-16: 4 mg via INTRAVENOUS
  Filled 2014-07-16: qty 1

## 2014-07-16 MED ORDER — ONDANSETRON HCL 4 MG/2ML IJ SOLN
4.0000 mg | Freq: Once | INTRAMUSCULAR | Status: AC
  Administered 2014-07-16: 4 mg via INTRAVENOUS

## 2014-07-16 MED ORDER — PANTOPRAZOLE SODIUM 40 MG PO TBEC: 40.0000 mg | DELAYED_RELEASE_TABLET | Freq: Two times a day (BID) | ORAL | Status: DC

## 2014-07-16 MED ORDER — PANTOPRAZOLE SODIUM 40 MG PO TBEC
DELAYED_RELEASE_TABLET | ORAL | Status: AC
  Administered 2014-07-16: 40 mg via ORAL
  Filled 2014-07-16: qty 1

## 2014-07-16 MED ORDER — GI COCKTAIL ~~LOC~~
ORAL | Status: AC
  Administered 2014-07-16: 30 mL via ORAL
  Filled 2014-07-16: qty 30

## 2014-07-16 MED ORDER — IOHEXOL 300 MG/ML  SOLN
100.0000 mL | Freq: Once | INTRAMUSCULAR | Status: AC | PRN
  Administered 2014-07-16: 100 mL via INTRAVENOUS

## 2014-07-16 MED ORDER — PANTOPRAZOLE SODIUM 40 MG PO TBEC
40.0000 mg | DELAYED_RELEASE_TABLET | Freq: Every day | ORAL | Status: DC
  Administered 2014-07-16: 40 mg via ORAL

## 2014-07-16 MED ORDER — MORPHINE SULFATE 2 MG/ML IJ SOLN
2.0000 mg | Freq: Once | INTRAMUSCULAR | Status: AC
  Administered 2014-07-16: 2 mg via INTRAVENOUS

## 2014-07-16 MED ORDER — ONDANSETRON HCL 4 MG/2ML IJ SOLN
INTRAMUSCULAR | Status: AC
  Administered 2014-07-16: 4 mg via INTRAVENOUS
  Filled 2014-07-16: qty 2

## 2014-07-16 MED ORDER — MORPHINE SULFATE 2 MG/ML IJ SOLN
INTRAMUSCULAR | Status: AC
  Administered 2014-07-16: 2 mg via INTRAVENOUS
  Filled 2014-07-16: qty 1

## 2014-07-16 NOTE — ED Provider Notes (Signed)
[facility] Emergency Department Provider Note  ____________________________________________  Time seen: 5:45 AM  I have reviewed the triage vital signs and the nursing notes.   HISTORY  Chief Complaint Abdominal Pain     HPI Vanessa Simpson is a 44 y.o. female presents with epigastric abdominal pain 2 days accompanied by nausea and diarrhea. Patient states the symptoms are worse following eating. In addition patient admits to previous episode of peptic ulcer.     Past medical history Peptic ulcer   No past surgical history on file.  No current outpatient prescriptions on file.  Allergies Codeine; Flagyl; and Sulfa antibiotics  No family history on file.  Social History Tobacco use: Yes half a pack per day Alcohol use: Yes very infrequently  Review of Systems  Constitutional: Negative for fever. Eyes: Negative for visual changes. ENT: Negative for sore throat. Cardiovascular: Negative for chest pain. Respiratory: Negative for shortness of breath. Gastrointestinal: Positive for abdominal pain, vomiting and diarrhea. Genitourinary: Negative for dysuria. Musculoskeletal: Negative for back pain. Skin: Negative for rash. Neurological: Negative for headaches, focal weakness or numbness.   10-point ROS otherwise negative.  ____________________________________________   PHYSICAL EXAM:  VITAL SIGNS: ED Triage Vitals  Enc Vitals Group     BP 07/16/14 0541 140/81 mmHg     Pulse Rate 07/16/14 0541 89     Resp 07/16/14 0541 18     Temp 07/16/14 0541 97.8 F (36.6 C)     Temp Source 07/16/14 0541 Oral     SpO2 07/16/14 0541 97 %     Weight 07/16/14 0541 190 lb (86.183 kg)     Height 07/16/14 0541  (1.626 m)     Head Cir --      Peak Flow --      Pain Score 07/16/14 0542 8     Pain Loc --      Pain Edu? --      Excl. in GC? --    Constitutional: Alert and oriented. Well appearing and in no distress. Eyes: Conjunctivae are  normal. PERRL. Normal extraocular movements. ENT   Head: Normocephalic and atraumatic.   Nose: No congestion/rhinnorhea.   Mouth/Throat: Mucous membranes are moist.   Neck: No stridor. Cardiovascular: Normal rate, regular rhythm. Normal and symmetric distal pulses are present in all extremities. No murmurs, rubs, or gallops. Respiratory: Normal respiratory effort without tachypnea nor retractions. Breath sounds are clear and equal bilaterally. No wheezes/rales/rhonchi. Gastrointestinal: Positive right upper quadrant/epigastric pain. No distention. There is no CVA tenderness. Genitourinary: deferred Musculoskeletal: Nontender with normal range of motion in all extremities. No joint effusions.  No lower extremity tenderness nor edema. Neurologic:  Normal speech and language. No gross focal neurologic deficits are appreciated. Speech is normal.  Skin:  Skin is warm, dry and intact. No rash noted. Psychiatric: Mood and affect are normal. Speech and behavior are normal. Patient exhibits appropriate insight and judgment.  ____________________________________________    LABS (pertinent positives/negatives)    RADIOLOGY  CT Abdomen and pelvis revealed:  IMPRESSION: Stable small cyst arising from the posterior body of the pancreas. Question residua of previous inflammation.  Liver prominent without focal lesion.  No bowel obstruction. No abscess. No mesenteric inflammation. Appendix appears normal.  Multiple colonic diverticula scattered throughout the colon without diverticulitis.  No renal or ureteral calculus. No hydronephrosis.      INITIAL IMPRESSION / ASSESSMENT AND PLAN / ED COURSE  Pertinent labs & imaging results that were available during my care of  the patient were reviewed by me and considered in my medical decision making (see chart for details).    ____________________________________________   FINAL CLINICAL IMPRESSION(S) / ED  DIAGNOSES  Final diagnoses:  Epigastric pain      Darci Currentandolph N Demarie Hyneman, MD 07/21/14 [phone]

## 2014-07-16 NOTE — ED Notes (Signed)
Back from ct   States pain is about a 4 /10

## 2014-07-16 NOTE — ED Provider Notes (Addendum)
  Physical Exam  BP 103/73 mmHg  Pulse 59  Temp(Src) 97.8 F (36.6 C) (Oral)  Resp 16  Ht 5\' 4"  (1.626 m)  Wt 190 lb (86.183 kg)  BMI 32.60 kg/m2  SpO2 97%  Plan is to follow-up with the radiology results. Reassess and disposition. ----------------------------------------- 10:11 AM on 07/16/2014 -----------------------------------------   Physical Exam Patient resting comfortably upon reevaluation. Says that pain improved after multiple doses of pain medications. Says the pain feels similar to previous ulcers.  No acute findings on her CAT scan of the abdomen and pelvis. I reviewed the results with the patient. We'll discharge home with PPI as well as follow-up with GI and primary care. ED Course  Procedures       Myrna Blazeravid Matthew Schaevitz, MD 07/16/14 1012  Patient's abdomen reexamined and soft with mild pain to the epigastrium and left upper quadrant.  Myrna Blazeravid Matthew Schaevitz, MD 07/16/14 1012  Also tolerated by mouth contrast without vomiting.  Myrna Blazeravid Matthew Schaevitz, MD 07/16/14 1014

## 2014-07-16 NOTE — Discharge Instructions (Signed)

## 2014-07-16 NOTE — ED Notes (Signed)
Pt in with co epigastric pain, dizziness for 2 days.

## 2014-07-19 ENCOUNTER — Encounter: Payer: Self-pay | Admitting: Emergency Medicine

## 2014-12-15 ENCOUNTER — Encounter: Payer: Self-pay | Admitting: Emergency Medicine

## 2014-12-15 ENCOUNTER — Emergency Department
Admission: EM | Admit: 2014-12-15 | Discharge: 2014-12-15 | Disposition: A | Discharge status: Home / Self Care | Payer: Self-pay | Attending: Emergency Medicine | Admitting: Emergency Medicine

## 2014-12-15 ENCOUNTER — Emergency Department: Payer: Self-pay

## 2014-12-15 DIAGNOSIS — Y9389 Activity, other specified: Secondary | ICD-10-CM | POA: Insufficient documentation

## 2014-12-15 DIAGNOSIS — S9002XA Contusion of left ankle, initial encounter: Secondary | ICD-10-CM | POA: Insufficient documentation

## 2014-12-15 DIAGNOSIS — F17210 Nicotine dependence, cigarettes, uncomplicated: Secondary | ICD-10-CM | POA: Insufficient documentation

## 2014-12-15 DIAGNOSIS — Y990 Civilian activity done for income or pay: Secondary | ICD-10-CM | POA: Insufficient documentation

## 2014-12-15 DIAGNOSIS — W208XXA Other cause of strike by thrown, projected or falling object, initial encounter: Secondary | ICD-10-CM | POA: Insufficient documentation

## 2014-12-15 DIAGNOSIS — Y9289 Other specified places as the place of occurrence of the external cause: Secondary | ICD-10-CM | POA: Insufficient documentation

## 2014-12-15 DIAGNOSIS — Z79899 Other long term (current) drug therapy: Secondary | ICD-10-CM | POA: Insufficient documentation

## 2014-12-15 HISTORY — DX: Calculus of kidney: N20.0

## 2014-12-15 MED ORDER — IBUPROFEN 800 MG PO TABS: 800.0000 mg | ORAL_TABLET | Freq: Three times a day (TID) | ORAL | Status: DC

## 2014-12-15 MED ORDER — IBUPROFEN 800 MG PO TABS
800.0000 mg | ORAL_TABLET | Freq: Once | ORAL | Status: AC
  Administered 2014-12-15: 800 mg via ORAL
  Filled 2014-12-15: qty 1

## 2014-12-15 NOTE — ED Notes (Addendum)
Pt to triage via w/c with no distress noted; pt reports shelf fell on left ankle; c/o persistent pain since; st previous fx of same; ibuprofen taken at 1pm

## 2014-12-15 NOTE — Discharge Instructions (Signed)
Cryotherapy Cryotherapy is when you put ice on your injury. Ice helps lessen pain and puffiness (swelling) after an injury. Ice works the best when you start using it in the first 24 to 48 hours after an injury. HOME CARE  Put a dry or damp towel between the ice pack and your skin.  You may press gently on the ice pack.  Leave the ice on for no more than 10 to 20 minutes at a time.  Check your skin after 5 minutes to make sure your skin is okay.  Rest at least 20 minutes between ice pack uses.  Stop using ice when your skin loses feeling (numbness).  Do not use ice on someone who cannot tell you when it hurts. This includes small children and people with memory problems (dementia). GET HELP RIGHT AWAY IF:  You have white spots on your skin.  Your skin turns blue or pale.  Your skin feels waxy or hard.  Your puffiness gets worse. MAKE SURE YOU:   Understand these instructions.  Will watch your condition.  Will get help right away if you are not doing well or get worse.   This information is not intended to replace advice given to you by your health care provider. Make sure you discuss any questions you have with your health care provider.   Document Released: 06/20/2007 Document Revised: 03/26/2011 Document Reviewed: 08/24/2010 Elsevier Interactive Patient Education 2016 [employer]  Contusion A contusion is a deep bruise. Contusions happen when an injury causes bleeding under the skin. Symptoms of bruising include pain, swelling, and discolored skin. The skin may turn blue, purple, or yellow. HOME CARE   Rest the injured area.  If told, put ice on the injured area.  Put ice in a plastic bag.  Place a towel between your skin and the bag.  Leave the ice on for 20 minutes, 2-3 times per day.  If told, put light pressure (compression) on the injured area using an elastic bandage. Make sure the bandage is not too tight. Remove it and put it back on as told by your  doctor.  If possible, raise (elevate) the injured area above the level of your heart while you are sitting or lying down.  Take over-the-counter and prescription medicines only as told by your doctor. GET HELP IF:  Your symptoms do not get better after several days of treatment.  Your symptoms get worse.  You have trouble moving the injured area. GET HELP RIGHT AWAY IF:   You have very bad pain.  You have a loss of feeling (numbness) in a hand or foot.  Your hand or foot turns pale or cold.   This information is not intended to replace advice given to you by your health care provider. Make sure you discuss any questions you have with your health care provider.   Document Released: 06/20/2007 Document Revised: 09/22/2014 Document Reviewed: 05/19/2014 Elsevier Interactive Patient Education 2016 [employer]   Use crutches as needed for walking until you're able to bear weight without pain. Ice and elevate as needed for swelling and pain. Ibuprofen with food as needed for pain. Follow-up with Dr. Alberteen Spindleline if any continued problems with her ankle or foot.

## 2014-12-15 NOTE — ED Provider Notes (Signed)
[facility] Emergency Department Provider Note  ____________________________________________  Time seen: Approximately 9:25 PM  I have reviewed the triage vital signs and the nursing notes.   HISTORY  Chief Complaint Ankle Pain  HPI Vanessa Simpson is a 44 y.o. female is here complaining of left ankle pain. Patient states this is a workman's comp injury that occurred today. She states she was reaching up and the shelf fell onto her left ankle. She denies any previous fracture in the same area. She took ibuprofen at approximately 1 PM. She has been unable to bear weight on it since being in the emergency room without severe pain. She denies any paresthesias distally to her injury. There is moderate amount of swelling in the area as well as bruising. Currently she rates her pain as 6 out of 10.   Past Medical History  Diagnosis Date  . Kidney stones     There are no active problems to display for this patient.   Past Surgical History  Procedure Laterality Date  . Abdominal hysterectomy      Current Outpatient Rx  Name  Route  Sig  Dispense  Refill  . omeprazole (PRILOSEC) 40 MG capsule   Oral   Take 1 capsule (40 mg total) by mouth daily.   30 capsule   1     Allergies Codeine; Flagyl; and Sulfa antibiotics  No family history on file.  Social History Social History  Substance Use Topics  . Smoking status: Current Every Day Smoker -- 0.50 packs/day    Types: Cigarettes  . Smokeless tobacco: None  . Alcohol Use: No    Review of Systems Constitutional: No fever/chills Eyes: No visual changes. ENT: No trauma Cardiovascular: Denies chest pain. Respiratory: Denies shortness of breath. Gastrointestinal: Initial nausea with injury resolved now, no vomiting.   Musculoskeletal: Negative for back pain. Skin: Left ankle bruising Neurological: Negative for headaches, focal weakness or numbness.  10-point ROS otherwise  negative.  ____________________________________________   PHYSICAL EXAM:  VITAL SIGNS: ED Triage Vitals  Enc Vitals Group     BP 12/15/14 2031 132/81 mmHg     Pulse Rate 12/15/14 2031 89     Resp 12/15/14 2031 18     Temp 12/15/14 2031 97.6 F (36.4 C)     Temp src --      SpO2 12/15/14 2031 99 %     Weight 12/15/14 2031 188 lb (85.276 kg)     Height 12/15/14 2031  (1.651 m)     Head Cir --      Peak Flow --      Pain Score 12/15/14 2031 6     Pain Loc --      Pain Edu? --      Excl. in GC? --     Constitutional: Alert and oriented. Well appearing and in no acute distress. Eyes: Conjunctivae are normal. PERRL. EOMI. Head: Atraumatic. Nose: No congestion/rhinnorhea. Neck: No stridor.   Cardiovascular: Normal rate, regular rhythm. Grossly normal heart sounds.  Good peripheral circulation. Respiratory: Normal respiratory effort.  No retractions. Lungs CTAB. Gastrointestinal: Soft and nontender. No distention.  Musculoskeletal: Left lateral ankle with moderate soft tissue edema. There is ecchymosis present. Marked tenderness on palpation to the left lateral malleolus. Range of motion is restricted secondary to pain. Motor sensory function intact. Neurologic:  Normal speech and language. No gross focal neurologic deficits are appreciated. No gait instability. Skin:  Skin is warm, dry and intact. Ecchymosis as noted  above. Psychiatric: Mood and affect are normal. Speech and behavior are normal.  ____________________________________________   LABS (all labs ordered are listed, but only abnormal results are displayed)  Labs Reviewed - No data to display   RADIOLOGY  Left ankle per radiologist and reviewed by me is negative for fracture dislocation. I, Tommi Rumpshonda L Summers, personally viewed and evaluated these images (plain radiographs) as part of my medical decision making.  ____________________________________________   PROCEDURES  Procedure(s) performed:  None  Critical Care performed: No  ____________________________________________   INITIAL IMPRESSION / ASSESSMENT AND PLAN / ED COURSE  Pertinent labs & imaging results that were available during my care of the patient were reviewed by me and considered in my medical decision making (see chart for details).  Patient was placed on crutches and given ibuprofen as needed for pain. She is to ice and elevate as needed for swelling and pain. She is to follow-up with Dr. Alberteen Spindleline if any continued problems  ____________________________________________   FINAL CLINICAL IMPRESSION(S) / ED DIAGNOSES  Final diagnoses:  Contusion of left ankle, initial encounter      Tommi RumpsRhonda L Summers, PA-C 12/15/14 2141  Jeanmarie PlantJames A McShane, MD 12/15/14 2318

## 2015-03-07 ENCOUNTER — Emergency Department: Payer: Self-pay

## 2015-03-07 ENCOUNTER — Emergency Department
Admission: EM | Admit: 2015-03-07 | Discharge: 2015-03-07 | Disposition: A | Discharge status: Home / Self Care | Payer: Self-pay | Attending: Emergency Medicine | Admitting: Emergency Medicine

## 2015-03-07 ENCOUNTER — Encounter: Payer: Self-pay | Admitting: Emergency Medicine

## 2015-03-07 DIAGNOSIS — R079 Chest pain, unspecified: Secondary | ICD-10-CM

## 2015-03-07 DIAGNOSIS — Z79899 Other long term (current) drug therapy: Secondary | ICD-10-CM | POA: Insufficient documentation

## 2015-03-07 DIAGNOSIS — R110 Nausea: Secondary | ICD-10-CM

## 2015-03-07 DIAGNOSIS — Z791 Long term (current) use of non-steroidal anti-inflammatories (NSAID): Secondary | ICD-10-CM | POA: Insufficient documentation

## 2015-03-07 DIAGNOSIS — F17210 Nicotine dependence, cigarettes, uncomplicated: Secondary | ICD-10-CM | POA: Insufficient documentation

## 2015-03-07 DIAGNOSIS — R1011 Right upper quadrant pain: Secondary | ICD-10-CM

## 2015-03-07 DIAGNOSIS — R1013 Epigastric pain: Secondary | ICD-10-CM | POA: Insufficient documentation

## 2015-03-07 HISTORY — DX: Gastro-esophageal reflux disease without esophagitis: K21.9

## 2015-03-07 LAB — BASIC METABOLIC PANEL
ANION GAP: 9 (ref 5–15)
BUN: 11 mg/dL (ref 6–20)
CALCIUM: 9.3 mg/dL (ref 8.9–10.3)
CO2: 21 mmol/L — ABNORMAL LOW (ref 22–32)
Chloride: 108 mmol/L (ref 101–111)
Creatinine, Ser: 0.63 mg/dL (ref 0.44–1.00)
GFR calc Af Amer: >60 mL/min (ref >60)
GLUCOSE: 112 mg/dL — AB (ref 65–99)
Potassium: 3.7 mmol/L (ref 3.5–5.1)
Sodium: 138 mmol/L (ref 135–145)

## 2015-03-07 LAB — HEPATIC FUNCTION PANEL
ALT: 22 U/L (ref 14–54)
AST: 20 U/L (ref 15–41)
Albumin: 4.3 g/dL (ref 3.5–5.0)
Alkaline Phosphatase: 50 U/L (ref 38–126)
BILIRUBIN DIRECT: 0.1 mg/dL (ref 0.1–0.5)
BILIRUBIN INDIRECT: 0.5 mg/dL (ref 0.3–0.9)
Total Bilirubin: 0.6 mg/dL (ref 0.3–1.2)
Total Protein: 7 g/dL (ref 6.5–8.1)

## 2015-03-07 LAB — CBC
HCT: 43.6 % (ref 35.0–47.0)
HEMOGLOBIN: 15 g/dL (ref 12.0–16.0)
MCH: 32 pg (ref 26.0–34.0)
MCHC: 34.5 g/dL (ref 32.0–36.0)
MCV: 92.6 fL (ref 80.0–100.0)
Platelets: 301 10*3/uL (ref 150–440)
RBC: 4.71 MIL/uL (ref 3.80–5.20)
RDW: 12.6 % (ref 11.5–14.5)
WBC: 12.2 10*3/uL — ABNORMAL HIGH (ref 3.6–11.0)

## 2015-03-07 LAB — LIPASE, BLOOD: LIPASE: 22 U/L (ref 11–51)

## 2015-03-07 LAB — FIBRIN DERIVATIVES D-DIMER (ARMC ONLY): FIBRIN DERIVATIVES D-DIMER (ARMC): 308 (ref 0–499)

## 2015-03-07 LAB — TROPONIN I

## 2015-03-07 MED ORDER — IOHEXOL 240 MG/ML SOLN
25.0000 mL | Freq: Once | INTRAMUSCULAR | Status: AC | PRN
  Administered 2015-03-07: 25 mL via ORAL

## 2015-03-07 MED ORDER — MORPHINE SULFATE (PF) 4 MG/ML IV SOLN
4.0000 mg | Freq: Once | INTRAVENOUS | Status: AC
  Administered 2015-03-07: 4 mg via INTRAVENOUS
  Filled 2015-03-07: qty 1

## 2015-03-07 MED ORDER — GI COCKTAIL ~~LOC~~
30.0000 mL | Freq: Once | ORAL | Status: AC
  Administered 2015-03-07: 30 mL via ORAL
  Filled 2015-03-07: qty 30

## 2015-03-07 MED ORDER — SUCRALFATE 1 G PO TABS
1.0000 g | ORAL_TABLET | Freq: Once | ORAL | Status: AC
  Administered 2015-03-07: 1 g via ORAL

## 2015-03-07 MED ORDER — ONDANSETRON HCL 4 MG/2ML IJ SOLN
4.0000 mg | Freq: Once | INTRAMUSCULAR | Status: AC | PRN
  Administered 2015-03-07: 4 mg via INTRAVENOUS
  Filled 2015-03-07: qty 2

## 2015-03-07 MED ORDER — IPRATROPIUM-ALBUTEROL 0.5-2.5 (3) MG/3ML IN SOLN: 3.0000 mL | Freq: Once | RESPIRATORY_TRACT | Status: DC

## 2015-03-07 MED ORDER — IOHEXOL 300 MG/ML  SOLN
100.0000 mL | Freq: Once | INTRAMUSCULAR | Status: AC | PRN
  Administered 2015-03-07: 100 mL via INTRAVENOUS

## 2015-03-07 MED ORDER — SUCRALFATE 1 G PO TABS
ORAL_TABLET | ORAL | Status: AC
  Administered 2015-03-07: 1 g via ORAL
  Filled 2015-03-07: qty 1

## 2015-03-07 MED ORDER — PANTOPRAZOLE SODIUM 40 MG PO TBEC
80.0000 mg | DELAYED_RELEASE_TABLET | Freq: Once | ORAL | Status: AC
  Administered 2015-03-07: 80 mg via ORAL
  Filled 2015-03-07: qty 2

## 2015-03-07 MED ORDER — ONDANSETRON HCL 4 MG/2ML IJ SOLN
4.0000 mg | Freq: Once | INTRAMUSCULAR | Status: AC
  Administered 2015-03-07: 4 mg via INTRAVENOUS
  Filled 2015-03-07: qty 2

## 2015-03-07 MED ORDER — FENTANYL CITRATE (PF) 100 MCG/2ML IJ SOLN
25.0000 ug | Freq: Once | INTRAMUSCULAR | Status: AC
  Administered 2015-03-07: 25 ug via INTRAVENOUS
  Filled 2015-03-07: qty 2

## 2015-03-07 MED ORDER — SUCRALFATE 1 G PO TABS: 1.0000 g | ORAL_TABLET | Freq: Two times a day (BID) | ORAL | Status: DC

## 2015-03-07 NOTE — ED Notes (Signed)
Patient transported to CT 

## 2015-03-07 NOTE — ED Notes (Signed)
While in with the pt for discharge review/instruction, the pt c/o abd pain increasing and tearful, MD notified and changed pt status for further testing and treatment.

## 2015-03-07 NOTE — ED Notes (Signed)
Patient transported to X-ray 

## 2015-03-07 NOTE — ED Notes (Signed)
Pt returned from CT, resting in bed in no acute distress 

## 2015-03-07 NOTE — Discharge Instructions (Signed)
Nonspecific Chest Pain  °Chest pain can be caused by many different conditions. There is always a chance that your pain could be related to something serious, such as a heart attack or a blood clot in your lungs. Chest pain can also be caused by conditions that are not life-threatening. If you have chest pain, it is very important to follow up with your health care provider. °CAUSES  °Chest pain can be caused by: °· Heartburn. °· Pneumonia or bronchitis. °· Anxiety or stress. °· Inflammation around your heart (pericarditis) or lung (pleuritis or pleurisy). °· A blood clot in your lung. °· A collapsed lung (pneumothorax). It can develop suddenly on its own (spontaneous pneumothorax) or from trauma to the chest. °· Shingles infection (varicella-zoster virus). °· Heart attack. °· Damage to the bones, muscles, and cartilage that make up your chest wall. This can include: °· Bruised bones due to injury. °· Strained muscles or cartilage due to frequent or repeated coughing or overwork. °· Fracture to one or more ribs. °· Sore cartilage due to inflammation (costochondritis). °RISK FACTORS  °Risk factors for chest pain may include: °· Activities that increase your risk for trauma or injury to your chest. °· Respiratory infections or conditions that cause frequent coughing. °· Medical conditions or overeating that can cause heartburn. °· Heart disease or family history of heart disease. °· Conditions or health behaviors that increase your risk of developing a blood clot. °· Having had chicken pox (varicella zoster). °SIGNS AND SYMPTOMS °Chest pain can feel like: °· Burning or tingling on the surface of your chest or deep in your chest. °· Crushing, pressure, aching, or squeezing pain. °· Dull or sharp pain that is worse when you move, cough, or take a deep breath. °· Pain that is also felt in your back, neck, shoulder, or arm, or pain that spreads to any of these areas. °Your chest pain may come and go, or it may stay  constant. °DIAGNOSIS °Lab tests or other studies may be needed to find the cause of your pain. Your health care provider may have you take a test called an ambulatory ECG (electrocardiogram). An ECG records your heartbeat patterns at the time the test is performed. You may also have other tests, such as: °· Transthoracic echocardiogram (TTE). During echocardiography, sound waves are used to create a picture of all of the heart structures and to look at how blood flows through your heart. °· Transesophageal echocardiogram (TEE). This is a more advanced imaging test that obtains images from inside your body. It allows your health care provider to see your heart in finer detail. °· Cardiac monitoring. This allows your health care provider to monitor your heart rate and rhythm in real time. °· Holter monitor. This is a portable device that records your heartbeat and can help to diagnose abnormal heartbeats. It allows your health care provider to track your heart activity for several days, if needed. °· Stress tests. These can be done through exercise or by taking medicine that makes your heart beat more quickly. °· Blood tests. °· Imaging tests. °TREATMENT  °Your treatment depends on what is causing your chest pain. Treatment may include: °· Medicines. These may include: °¨ Acid blockers for heartburn. °¨ Anti-inflammatory medicine. °¨ Pain medicine for inflammatory conditions. °¨ Antibiotic medicine, if an infection is present. °¨ Medicines to dissolve blood clots. °¨ Medicines to treat coronary artery disease. °· Supportive care for conditions that do not require medicines. This may include: °¨ Resting. °¨ Applying heat   or cold packs to injured areas. °¨ Limiting activities until pain decreases. °HOME CARE INSTRUCTIONS °· If you were prescribed an antibiotic medicine, finish it all even if you start to feel better. °· Avoid any activities that bring on chest pain. °· Do not use any tobacco products, including  cigarettes, chewing tobacco, or electronic cigarettes. If you need help quitting, ask your health care provider. °· Do not drink alcohol. °· Take medicines only as directed by your health care provider. °· Keep all follow-up visits as directed by your health care provider. This is important. This includes any further testing if your chest pain does not go away. °· If heartburn is the cause for your chest pain, you may be told to keep your head raised (elevated) while sleeping. This reduces the chance that acid will go from your stomach into your esophagus. °· Make lifestyle changes as directed by your health care provider. These may include: °¨ Getting regular exercise. Ask your health care provider to suggest some activities that are safe for you. °¨ Eating a heart-healthy diet. A registered dietitian can help you to learn healthy eating options. °¨ Maintaining a healthy weight. °¨ Managing diabetes, if necessary. °¨ Reducing stress. °SEEK MEDICAL CARE IF: °· Your chest pain does not go away after treatment. °· You have a rash with blisters on your chest. °· You have a fever. °SEEK IMMEDIATE MEDICAL CARE IF:  °· Your chest pain is worse. °· You have an increasing cough, or you cough up blood. °· You have severe abdominal pain. °· You have severe weakness. °· You faint. °· You have chills. °· You have sudden, unexplained chest discomfort. °· You have sudden, unexplained discomfort in your arms, back, neck, or jaw. °· You have shortness of breath at any time. °· You suddenly start to sweat, or your skin gets clammy. °· You feel nauseous or you vomit. °· You suddenly feel light-headed or dizzy. °· Your heart begins to beat quickly, or it feels like it is skipping beats. °These symptoms may represent a serious problem that is an emergency. Do not wait to see if the symptoms will go away. Get medical help right away. Call your local emergency services (911 in the [place] Do not drive yourself to the hospital. °  °This  information is not intended to replace advice given to you by your health care provider. Make sure you discuss any questions you have with your health care provider. °  °Document Released: 10/11/2004 Document Revised: 01/22/2014 Document Reviewed: 08/07/2013 °Elsevier Interactive Patient Education ©2016 [employer] ° °Abdominal Pain, Adult °Many things can cause abdominal pain. Usually, abdominal pain is not caused by a disease and will improve without treatment. It can often be observed and treated at home. Your health care provider will do a physical exam and possibly order blood tests and X-rays to help determine the seriousness of your pain. However, in many cases, more time must pass before a clear cause of the pain can be found. Before that point, your health care provider may not know if you need more testing or further treatment. °HOME CARE INSTRUCTIONS °Monitor your abdominal pain for any changes. The following actions may help to alleviate any discomfort you are experiencing: °· Only take over-the-counter or prescription medicines as directed by your health care provider. °· Do not take laxatives unless directed to do so by your health care provider. °· Try a clear liquid diet (broth, tea, or water) as directed by your health care   provider. Slowly move to a bland diet as tolerated. °SEEK MEDICAL CARE IF: °· You have unexplained abdominal pain. °· You have abdominal pain associated with nausea or diarrhea. °· You have pain when you urinate or have a bowel movement. °· You experience abdominal pain that wakes you in the night. °· You have abdominal pain that is worsened or improved by eating food. °· You have abdominal pain that is worsened with eating fatty foods. °· You have a fever. °SEEK IMMEDIATE MEDICAL CARE IF: °· Your pain does not go away within 2 hours. °· You keep throwing up (vomiting). °· Your pain is felt only in portions of the abdomen, such as the right side or the left lower portion of the  abdomen. °· You pass bloody or black tarry stools. °MAKE SURE YOU: °· Understand these instructions. °· Will watch your condition. °· Will get help right away if you are not doing well or get worse. °  °This information is not intended to replace advice given to you by your health care provider. Make sure you discuss any questions you have with your health care provider. °  °Document Released: 10/11/2004 Document Revised: 09/22/2014 Document Reviewed: 09/10/2012 °Elsevier Interactive Patient Education ©2016 [employer] ° °

## 2015-03-07 NOTE — ED Provider Notes (Signed)
[facility] Provider Note  ____________________________________________  Time seen: 7:15 AM  I have reviewed the triage vital signs and the nursing notes.   HISTORY  Chief Complaint Chest Pain and Abdominal Pain      HPI Vanessa Simpson is a 45 y.o. female with history of GERD and kidney stones presents with awaking at 3:30 AM this morning with central chest pain radiating to the left chest left neck and left shoulder blade. Patient also admits to epigastric discomfort associated with nausea but no vomiting. Patient denies any fever no diarrhea or constipation.     Past Medical History  Diagnosis Date  . Kidney stones   . GERD (gastroesophageal reflux disease)     There are no active problems to display for this patient.   Past Surgical History  Procedure Laterality Date  . Abdominal hysterectomy      Current Outpatient Rx  Name  Route  Sig  Dispense  Refill  . ibuprofen (ADVIL,MOTRIN) 800 MG tablet   Oral   Take 1 tablet (800 mg total) by mouth 3 (three) times daily.   30 tablet   0   . omeprazole (PRILOSEC) 40 MG capsule   Oral   Take 1 capsule (40 mg total) by mouth daily.   30 capsule   1     Allergies Codeine; Flagyl; and Sulfa antibiotics  History reviewed. No pertinent family history.  Social History Social History  Substance Use Topics  . Smoking status: Current Every Day Smoker -- 1.00 packs/day    Types: Cigarettes  . Smokeless tobacco: None  . Alcohol Use: No    Review of Systems  Constitutional: Negative for fever. Eyes: Negative for visual changes. ENT: Negative for sore throat. Cardiovascular: Positive for chest pain. Respiratory: Negative for shortness of breath. Gastrointestinal: Positive for abdominal pain and nausea Genitourinary: Negative for dysuria. Musculoskeletal: Negative for back pain. Skin: Negative for rash. Neurological: Negative for headaches, focal weakness or  numbness.   10-point ROS otherwise negative.  ____________________________________________   PHYSICAL EXAM:  VITAL SIGNS: ED Triage Vitals  Enc Vitals Group     BP 03/07/15 0447 129/91 mmHg     Pulse Rate 03/07/15 0447 93     Resp 03/07/15 0447 20     Temp 03/07/15 0447 97.5 F (36.4 C)     Temp Source 03/07/15 0447 Oral     SpO2 03/07/15 0447 98 %     Weight 03/07/15 0447 188 lb (85.276 kg)     Height 03/07/15 0447  (1.651 m)     Head Cir --      Peak Flow --      Pain Score 03/07/15 0447 7     Pain Loc --      Pain Edu? --      Excl. in GC? --     Constitutional: Alert and oriented. Well appearing and in no distress. Eyes: Conjunctivae are normal. PERRL. Normal extraocular movements. ENT   Head: Normocephalic and atraumatic.   Nose: No congestion/rhinnorhea.   Mouth/Throat: Mucous membranes are moist.   Neck: No stridor. Hematological/Lymphatic/Immunilogical: No cervical lymphadenopathy. Cardiovascular: Normal rate, regular rhythm. Normal and symmetric distal pulses are present in all extremities. No murmurs, rubs, or gallops. Respiratory: Normal respiratory effort without tachypnea nor retractions. Breath sounds are clear and equal bilaterally. No wheezes/rales/rhonchi. Gastrointestinal: Soft and nontender. No distention. There is no CVA tenderness. Genitourinary: deferred Musculoskeletal: Nontender with normal range of motion in all extremities. No  joint effusions.  No lower extremity tenderness nor edema. Neurologic:  Normal speech and language. No gross focal neurologic deficits are appreciated. Speech is normal.  Skin:  Skin is warm, dry and intact. No rash noted. Psychiatric: Mood and affect are normal. Speech and behavior are normal. Patient exhibits appropriate insight and judgment.  ____________________________________________    LABS (pertinent positives/negatives)  Labs Reviewed  BASIC METABOLIC PANEL - Abnormal; Notable for the  following:    CO2 21 (*)    Glucose, Bld 112 (*)    All other components within normal limits  CBC - Abnormal; Notable for the following:    WBC 12.2 (*)    All other components within normal limits  TROPONIN I  LIPASE, BLOOD  HEPATIC FUNCTION PANEL  TROPONIN I  FIBRIN DERIVATIVES D-DIMER ([facility] ONLY)     ____________________________________________   EKG  ED ECG REPORT I, Shep Porter, [place], the attending physician, personally viewed and interpreted this ECG.   Date: 03/07/2015  EKG Time: 4:49 AM  Rate: 85  Rhythm: Normal sinus rhythm  Axis: Normal  Intervals: Normal  ST&T Change: None   ____________________________________________    RADIOLOGY  US Abdomen Limited RUQ (Final result) Result time: 03/07/15 08:03:31   Final result by Rad Results In Interface (03/07/15 08:03:31)   Narrative:   CLINICAL DATA: Right upper quadrant pain.  EXAM: US ABDOMEN LIMITED - RIGHT UPPER QUADRANT  COMPARISON: CT 07/16/2014. Ultrasound 07/16/2014.  FINDINGS: Gallbladder:  No gallstones or wall thickening visualized. No sonographic Murphy sign noted by sonographer.  Common bile duct:  Diameter: 3.0  Liver:  No focal lesion identified. Within normal limits in parenchymal echogenicity.  IMPRESSION: Normal exam.   Electronically Signed By: Maisie Fus Register On: 03/07/2015 08:03          DG Chest 2 View (Final result) Result time: 03/07/15 06:07:46   Final result by Rad Results In Interface (03/07/15 06:07:46)   Narrative:   CLINICAL DATA: Acute onset of left upper chest aching, radiating to the left upper arm. Epigastric pain. Nausea. Initial encounter.  EXAM: CHEST 2 VIEW  COMPARISON: Chest radiograph performed 11/11/2012  FINDINGS: The lungs are well-aerated and clear. There is no evidence of focal opacification, pleural effusion or pneumothorax.  The heart is normal in size; the mediastinal contour is within normal limits. No acute  osseous abnormalities are seen.  IMPRESSION: No acute cardiopulmonary process seen.   Electronically Signed By: Roanna Raider M.D. On: 03/07/2015 06:07      INITIAL IMPRESSION / ASSESSMENT AND PLAN / ED COURSE  Pertinent labs & imaging results that were available during my care of the patient were reviewed by me and considered in my medical decision making (see chart for details).  Given history of physical exam cardiac enzymes performed twice which were both negative EKG revealed no evidence of ST segment elevation. D-dimer negative. Patient received GI cocktail and Protonix with pain improvement. Patient will be referred to Dr. Juliann Pares for outpatient cardiac evaluation  ____________________________________________   FINAL CLINICAL IMPRESSION(S) / ED DIAGNOSES  Final diagnoses:  Chest pain, unspecified chest pain type      Darci Current, MD 03/07/15 1041

## 2015-03-07 NOTE — ED Notes (Signed)
Pt says she was awakened around 0330 with dull aching pain to left upper chest; radiates into left upper arm; also having aching pain to epigastric area; nausea with no vomiting; pt anxious in triage; no history of cardiac disease; pt last ate at 530pm yesterday;

## 2015-09-30 ENCOUNTER — Emergency Department
Admission: EM | Admit: 2015-09-30 | Discharge: 2015-09-30 | Disposition: A | Discharge status: Home / Self Care | Payer: Self-pay | Attending: Emergency Medicine | Admitting: Emergency Medicine

## 2015-09-30 ENCOUNTER — Encounter: Payer: Self-pay | Admitting: Emergency Medicine

## 2015-09-30 DIAGNOSIS — R102 Pelvic and perineal pain: Secondary | ICD-10-CM | POA: Insufficient documentation

## 2015-09-30 DIAGNOSIS — R197 Diarrhea, unspecified: Secondary | ICD-10-CM | POA: Insufficient documentation

## 2015-09-30 DIAGNOSIS — F17210 Nicotine dependence, cigarettes, uncomplicated: Secondary | ICD-10-CM | POA: Insufficient documentation

## 2015-09-30 DIAGNOSIS — Z5181 Encounter for therapeutic drug level monitoring: Secondary | ICD-10-CM | POA: Insufficient documentation

## 2015-09-30 LAB — URINALYSIS COMPLETE WITH MICROSCOPIC (ARMC ONLY)
BACTERIA UA: NONE SEEN
Bilirubin Urine: NEGATIVE
GLUCOSE, UA: NEGATIVE mg/dL
Hgb urine dipstick: NEGATIVE
Ketones, ur: NEGATIVE mg/dL
Nitrite: NEGATIVE
PROTEIN: NEGATIVE mg/dL
SPECIFIC GRAVITY, URINE: 1.013 (ref 1.005–1.030)
pH: 5 (ref 5.0–8.0)

## 2015-09-30 LAB — COMPREHENSIVE METABOLIC PANEL
ALBUMIN: 4.3 g/dL (ref 3.5–5.0)
ALK PHOS: 45 U/L (ref 38–126)
ALT: 16 U/L (ref 14–54)
AST: 16 U/L (ref 15–41)
Anion gap: 5 (ref 5–15)
BUN: 13 mg/dL (ref 6–20)
CALCIUM: 9.1 mg/dL (ref 8.9–10.3)
CHLORIDE: 109 mmol/L (ref 101–111)
CO2: 22 mmol/L (ref 22–32)
CREATININE: 0.73 mg/dL (ref 0.44–1.00)
GFR calc non Af Amer: >60 mL/min (ref >60)
GLUCOSE: 108 mg/dL — AB (ref 65–99)
Potassium: 4.3 mmol/L (ref 3.5–5.1)
SODIUM: 136 mmol/L (ref 135–145)
Total Bilirubin: 0.5 mg/dL (ref 0.3–1.2)
Total Protein: 7.4 g/dL (ref 6.5–8.1)

## 2015-09-30 LAB — URINE DRUG SCREEN, QUALITATIVE (ARMC ONLY)
Amphetamines, Ur Screen: NOT DETECTED
BARBITURATES, UR SCREEN: NOT DETECTED
Benzodiazepine, Ur Scrn: NOT DETECTED
COCAINE METABOLITE, UR ~~LOC~~: NOT DETECTED
Cannabinoid 50 Ng, Ur ~~LOC~~: POSITIVE — AB
MDMA (Ecstasy)Ur Screen: NOT DETECTED
METHADONE SCREEN, URINE: NOT DETECTED
Opiate, Ur Screen: NOT DETECTED
Phencyclidine (PCP) Ur S: NOT DETECTED
TRICYCLIC, UR SCREEN: NOT DETECTED

## 2015-09-30 LAB — CBC WITH DIFFERENTIAL/PLATELET
BASOS PCT: 1 %
Basophils Absolute: 0.1 10*3/uL (ref 0–0.1)
EOS ABS: 0.6 10*3/uL (ref 0–0.7)
Eosinophils Relative: 6 %
HEMATOCRIT: 42.4 % (ref 35.0–47.0)
HEMOGLOBIN: 14.9 g/dL (ref 12.0–16.0)
LYMPHS ABS: 3.1 10*3/uL (ref 1.0–3.6)
Lymphocytes Relative: 31 %
MCH: 32.2 pg (ref 26.0–34.0)
MCHC: 35 g/dL (ref 32.0–36.0)
MCV: 91.9 fL (ref 80.0–100.0)
MONO ABS: 0.7 10*3/uL (ref 0.2–0.9)
MONOS PCT: 7 %
NEUTROS ABS: 5.8 10*3/uL (ref 1.4–6.5)
NEUTROS PCT: 55 %
Platelets: 244 10*3/uL (ref 150–440)
RBC: 4.62 MIL/uL (ref 3.80–5.20)
RDW: 12.8 % (ref 11.5–14.5)
WBC: 10.3 10*3/uL (ref 3.6–11.0)

## 2015-09-30 LAB — LIPASE, BLOOD: Lipase: 23 U/L (ref 11–51)

## 2015-09-30 MED ORDER — FENTANYL CITRATE (PF) 100 MCG/2ML IJ SOLN
50.0000 ug | Freq: Once | INTRAMUSCULAR | Status: AC
  Administered 2015-09-30: 50 ug via INTRAVENOUS
  Filled 2015-09-30: qty 2

## 2015-09-30 MED ORDER — ONDANSETRON HCL 4 MG PO TABS: 4.0000 mg | ORAL_TABLET | Freq: Three times a day (TID) | ORAL | 0 refills | Status: DC | PRN

## 2015-09-30 MED ORDER — SODIUM CHLORIDE 0.9 % IV BOLUS (SEPSIS): 1000.0000 mL | Freq: Once | INTRAVENOUS | Status: DC

## 2015-09-30 MED ORDER — SODIUM CHLORIDE 0.9 % IV BOLUS (SEPSIS)
1000.0000 mL | Freq: Once | INTRAVENOUS | Status: AC
  Administered 2015-09-30: 1000 mL via INTRAVENOUS

## 2015-09-30 MED ORDER — ONDANSETRON HCL 4 MG/2ML IJ SOLN
4.0000 mg | Freq: Once | INTRAMUSCULAR | Status: AC
  Administered 2015-09-30: 4 mg via INTRAVENOUS
  Filled 2015-09-30: qty 2

## 2015-09-30 NOTE — ED Notes (Signed)
Pt discharged home after verbalizing understanding of discharge instructions; nad noted. 

## 2015-09-30 NOTE — ED Notes (Signed)
Pt c/o lower pelvic pain radiating to left back beginning at 0100 this morning.

## 2015-09-30 NOTE — ED Notes (Signed)
Pt tender to palpation of pelvic region, with increasing level of tenderness on left. Pt also tender left lower back. Pt reports hx of hysterectomy. Pt report last sexual intercourse approx 2 months ago. Pt reports she did not use protection, as it was with long-term boyfriend

## 2015-09-30 NOTE — ED Provider Notes (Addendum)
[facility] Emergency Department Provider Note  ____________________________________________   I have reviewed the triage vital signs and the nursing notes.   HISTORY  Chief Complaint Pelvic Pain    HPI Vanessa Simpson is a 45 y.o. female presents today complaining of abdominal pain and vomiting. Patient has had innumerable episodes of this over the last few years. She has had by my count approximately 20 CT scans both here and at other facilities since 2009 for abdominal pain. She has also had many different ultrasounds of her gallbladder and other areas. Sometimes she has kidney stones, no other significant acute pathology has been detected. Patient has a history ofhysterectomy, remotely, and she is complaining of nausea, vomiting and diarrhea for the last few days with left lower quadrant abdominal discomfort. She has never had diverticulitis. She denies any fever or chills. She denies hematemesis bright red blood per rectum or melena. No recent travel no sick contacts.    Past Medical History:  Diagnosis Date  . GERD (gastroesophageal reflux disease)   . Kidney stones     There are no active problems to display for this patient.   Past Surgical History:  Procedure Laterality Date  . ABDOMINAL HYSTERECTOMY      Prior to Admission medications   Not on File    Allergies Codeine; Flagyl [metronidazole]; and Sulfa antibiotics  No family history on file.  Social History Social History  Substance Use Topics  . Smoking status: Current Every Day Smoker    Packs/day: 1.00    Types: Cigarettes  . Smokeless tobacco: Never Used  . Alcohol use No    Review of Systems Constitutional: No fever/chills Eyes: No visual changes. ENT: No sore throat. No stiff neck no neck pain Cardiovascular: Denies chest pain. Respiratory: Denies shortness of breath. Gastrointestinal:   no vomiting.  No diarrhea.  No constipation. Genitourinary: Negative for  dysuria. Musculoskeletal: Negative lower extremity swelling Skin: Negative for rash. Neurological: Negative for severe headaches, focal weakness or numbness. 10-point ROS otherwise negative.  ____________________________________________   PHYSICAL EXAM:  VITAL SIGNS: ED Triage Vitals  Enc Vitals Group     BP 09/30/15 0629 (!) 142/87     Pulse Rate 09/30/15 0629 90     Resp --      Temp 09/30/15 0629 98.1 F (36.7 C)     Temp Source 09/30/15 0629 Oral     SpO2 09/30/15 0629 98 %     Weight 09/30/15 0629 189 lb (85.7 kg)     Height 09/30/15 0629 5\' 5"  (1.651 m)     Head Circumference --      Peak Flow --      Pain Score 09/30/15 0627 7     Pain Loc --      Pain Edu? --      Excl. in GC? --     Constitutional: Alert and oriented. Well appearing and in no acute distress.Anxious and upset but nontoxic Eyes: Conjunctivae are normal. PERRL. EOMI. Head: Atraumatic. Nose: No congestion/rhinnorhea. Mouth/Throat: Mucous membranes are moist.  Oropharynx non-erythematous. Neck: No stridor.   Nontender with no meningismus Cardiovascular: Normal rate, regular rhythm. Grossly normal heart sounds.  Good peripheral circulation. Respiratory: Normal respiratory effort.  No retractions. Lungs CTAB. Abdominal: Soft and Minimal tenderness to palpation of the left lower quadrant . No distention. No guarding no rebound Back:  There is no focal tenderness or step off.  there is no midline tenderness there are no lesions noted. there is  no CVA tenderness Musculoskeletal: No lower extremity tenderness, no upper extremity tenderness. No joint effusions, no DVT signs strong distal pulses no edema Neurologic:  Normal speech and language. No gross focal neurologic deficits are appreciated.  Skin:  Skin is warm, dry and intact. No rash noted. Psychiatric: Mood and affect are normal. Speech and behavior are normal.  ____________________________________________   LABS (all labs ordered are listed, but  only abnormal results are displayed)  Labs Reviewed  URINALYSIS COMPLETEWITH MICROSCOPIC ([facility] ONLY) - Abnormal; Notable for the following:       Result Value   Color, Urine YELLOW (*)    APPearance CLEAR (*)    Leukocytes, UA TRACE (*)    Squamous Epithelial / LPF 0-5 (*)    All other components within normal limits  COMPREHENSIVE METABOLIC PANEL - Abnormal; Notable for the following:    Glucose, Bld 108 (*)    All other components within normal limits  URINE DRUG SCREEN, QUALITATIVE ([facility] ONLY) - Abnormal; Notable for the following:    Cannabinoid 50 Ng, Ur [place] POSITIVE (*)    All other components within normal limits  CBC WITH DIFFERENTIAL/PLATELET  LIPASE, BLOOD   ____________________________________________  EKG  I personally interpreted any EKGs ordered by me or triage  ____________________________________________  RADIOLOGY  I reviewed any imaging ordered by me or triage that were performed during my shift and, if possible, patient and/or family made aware of any abnormal findings. ____________________________________________   PROCEDURES  Procedure(s) performed: None  Procedures  Critical Care performed: None  ____________________________________________   INITIAL IMPRESSION / ASSESSMENT AND PLAN / ED COURSE  Pertinent labs & imaging results that were available during my care of the patient were reviewed by me and considered in my medical decision making (see chart for details).  Patient presents today complaining of recurrence of her chronic abdominal pain. She has had multiple different episodes of abdominal pain last few years mandating extensive visits to the emergency department. She has had a unsafe number prior CT scans. She has had 8 CT scans at this facility and appears and 9 CTs of the abdomen and pelvis and other facilities since 2007. There are 5 CT scans that I can find going back before that to 2002. This gives her total 22 different abdominal  pelvis CT scans in her life that I can see. None of them appear to change management or lead directly to any change in management to the extent that I can determine.  While none of this indicates that the patient has no acute pathology today, is certainly means that we should be very careful about CT scan. Patient is also innumerable x-rays and ultrasounds of her abdomen for chronic recurrent abdominal pain. If a CT scan of the abdomen and pelvis is equal to approximately 3 years of ambient radiation as some suggest, the patient is 66 years of ambient radiation into workups of her belly. At this time with reassuring exam reassuring blood work reassuring vital signs I do not think that further imaging is indicated. I do note a positive marijuana screen, there certainly is a center which we are seeing more and more a chronic recurring abdominal pain and vomiting in the context of a marijuana use and we will indicate to her that this is likely summoned she should stop. At this time I do not see any indication for acute imaging. She does not have evidence of a kidney stone she does not flank pain she does not have hematuria.  She does not have evidence of pyelonephritis appendicitis or cholecystitis etc. I will this time safely get her symptomatically feeling better with return precautions and follow-up given and understood. Patient should follow closely with GI. She states she's never followed up outside of the emergency room for these symptoms but now has insurance and thinks that she can.   ----------------------------------------- 9:29 AM on 09/30/2015 -----------------------------------------  Patient's pain is down to a 2 out of 10, she feels much better. feels no more nausea at this time and she would like to try by mouth she looks quite well her abdomen is benign. I have explained to her the reasons why don't think a CT scan is in her best interest specifically overwhelming radiation exposure over many  years and she agrees. She understands however this limits my ability to workup her pain more fully and she is more than willing to accept that limitation in exchange for a reduction and radiation exposure. We also discussed ultrasound with this time I do not see any utility in a ultrasounded patient would prefer not to stay for further evaluation. I think this is not unreasonable. We'll send her home with some pain medication for a day or 2 as well as nausea medication. She understands if she has worsening pain she should come back to the emergency department. She states she is trying to taper off her marijuana use because she has a new job and she believes she has not use to weeks. I did note that it is present in her urine. Of course we cannot say exactly how long it has been since she smoked but that is certainly either suggestive of a more recent use or a significant history of use. In any event, abdomen is benign patient feels much better, we will try to arrange outpatient follow-up. Extensive return precautions and follow-up given and understood. Patient also advised strongly not to drive after receiving fentanyl even on the half life is quite limited, can cause sedation.   Clinical Course   ____________________________________________   FINAL CLINICAL IMPRESSION(S) / ED DIAGNOSES  Final diagnoses:  None      This chart was dictated using voice recognition software.  Despite best efforts to proofread,  errors can occur which can change meaning.      Jeanmarie PlantJames A Sanela Evola, MD 09/30/15 [id number]    Jeanmarie PlantJames A Krzysztof Reichelt, MD 09/30/15 0930    Jeanmarie PlantJames A Etola Mull, MD 09/30/15 [phone]

## 2015-09-30 NOTE — ED Triage Notes (Signed)
Patient ambulatory to triage with steady gait, without difficulty or distress noted; pt reports mid subrapubic pain x week, moving now towards left side & flank accomp by N/V

## 2015-09-30 NOTE — ED Notes (Signed)
Pt reports she took 1000 mg tylenol at 0100 this morning

## 2017-01-24 ENCOUNTER — Other Ambulatory Visit: Payer: Self-pay

## 2017-01-24 ENCOUNTER — Emergency Department: Payer: Self-pay

## 2017-01-24 ENCOUNTER — Emergency Department
Admission: EM | Admit: 2017-01-24 | Discharge: 2017-01-24 | Disposition: A | Discharge status: Home / Self Care | Payer: Self-pay | Attending: Emergency Medicine | Admitting: Emergency Medicine

## 2017-01-24 DIAGNOSIS — Y998 Other external cause status: Secondary | ICD-10-CM | POA: Insufficient documentation

## 2017-01-24 DIAGNOSIS — W108XXA Fall (on) (from) other stairs and steps, initial encounter: Secondary | ICD-10-CM | POA: Insufficient documentation

## 2017-01-24 DIAGNOSIS — Y929 Unspecified place or not applicable: Secondary | ICD-10-CM | POA: Insufficient documentation

## 2017-01-24 DIAGNOSIS — Y9389 Activity, other specified: Secondary | ICD-10-CM | POA: Insufficient documentation

## 2017-01-24 DIAGNOSIS — F17210 Nicotine dependence, cigarettes, uncomplicated: Secondary | ICD-10-CM | POA: Insufficient documentation

## 2017-01-24 DIAGNOSIS — S93401A Sprain of unspecified ligament of right ankle, initial encounter: Secondary | ICD-10-CM | POA: Insufficient documentation

## 2017-01-24 MED ORDER — NAPROXEN 500 MG PO TABS: 500.0000 mg | ORAL_TABLET | Freq: Two times a day (BID) | ORAL | Status: DC

## 2017-01-24 NOTE — ED Notes (Signed)
Pt states fell yesterday, went to work, today less swollen but has a burning sensation so she wants it checked out.

## 2017-01-24 NOTE — ED Provider Notes (Signed)
[facility] Emergency Department Provider Note   ____________________________________________   First MD Initiated Contact with Patient 01/24/17 1512     (approximate)  I have reviewed the triage vital signs and the nursing notes.   HISTORY  Chief Complaint Ankle Pain    HPI Vanessa Simpson is a 47 y.o. female patient complained of right ankle pain second to a slip and fall yesterday.  Patient that she was walking her dog and slipped on the steps twisting her right ankle.  Patient is able to bear weight with difficulty.  Patient rates pain as a 5/10.  Patient described the pain as "aching".  Patient use over-the-counter anti-inflammatory medications.  Past Medical History:  Diagnosis Date  . GERD (gastroesophageal reflux disease)   . Kidney stones     There are no active problems to display for this patient.   Past Surgical History:  Procedure Laterality Date  . ABDOMINAL HYSTERECTOMY      Prior to Admission medications   Medication Sig Start Date End Date Taking? Authorizing Provider  naproxen (NAPROSYN) 500 MG tablet Take 1 tablet (500 mg total) by mouth 2 (two) times daily with a meal. 01/24/17   Joni ReiningSmith, Ronald K, PA-C  ondansetron (ZOFRAN) 4 MG tablet Take 1 tablet (4 mg total) by mouth every 8 (eight) hours as needed for nausea or vomiting. 09/30/15   Jeanmarie PlantMcShane, James A, MD    Allergies Codeine; Flagyl [metronidazole]; and Sulfa antibiotics  No family history on file.  Social History Social History   Tobacco Use  . Smoking status: Current Every Day Smoker    Packs/day: 1.00    Types: Cigarettes  . Smokeless tobacco: Never Used  Substance Use Topics  . Alcohol use: No  . Drug use: No    Review of Systems Constitutional: No fever/chills Eyes: No visual changes. ENT: No sore throat. Cardiovascular: Denies chest pain. Respiratory: Denies shortness of breath. Gastrointestinal: No abdominal pain.  No nausea, no vomiting.  No  diarrhea.  No constipation. Genitourinary: Negative for dysuria. Musculoskeletal: Right lateral ankle pain. Skin: Negative for rash. Neurological: Negative for headaches, focal weakness or numbness.   ____________________________________________   PHYSICAL EXAM:  VITAL SIGNS: ED Triage Vitals  Enc Vitals Group     BP 01/24/17 1418 (!) 142/92     Pulse Rate 01/24/17 1418 86     Resp 01/24/17 1418 17     Temp 01/24/17 1418 98 F (36.7 C)     Temp Source 01/24/17 1418 Oral     SpO2 01/24/17 1418 98 %     Weight 01/24/17 1418 189 lb (85.7 kg)     Height 01/24/17 1418 5\' 5"  (1.651 m)     Head Circumference --      Peak Flow --      Pain Score 01/24/17 1417 5     Pain Loc --      Pain Edu? --      Excl. in GC? --    Constitutional: Alert and oriented. Well appearing and in no acute distress. No cervical lymphadenopathy. Cardiovascular: Normal rate, regular rhythm. Grossly normal heart sounds.  Good peripheral circulation. Respiratory: Normal respiratory effort.  No retractions. Lungs CTAB. Musculoskeletal: No obvious deformity to the right ankle.  There is moderate amount of the lateral edema.  Patient has full and equal range of motion. Neurologic:  Normal speech and language. No gross focal neurologic deficits are appreciated. No gait instability. Skin:  Skin is warm, dry and intact.  No rash noted. Psychiatric: Mood and affect are normal. Speech and behavior are normal.  ____________________________________________   LABS (all labs ordered are listed, but only abnormal results are displayed)  Labs Reviewed - No data to display ____________________________________________  EKG   ____________________________________________  RADIOLOGY  Dg Ankle Complete Right  Result Date: 01/24/2017 CLINICAL DATA:  Right ankle pain after the slipped on step last night. Initial encounter. EXAM: RIGHT ANKLE - COMPLETE 3+ VIEW COMPARISON:  None. FINDINGS: There is no evidence of  fracture, dislocation, or joint effusion. There is no evidence of arthropathy or other focal bone abnormality. Calcaneal spur noted. Lateral soft tissues are mildly swollen. IMPRESSION: Lateral soft tissue swelling without underlying bony or joint abnormality. Electronically Signed   By: Drusilla Kanner M.D.   On: 01/24/2017 15:43    ____________________________________________   PROCEDURES  Procedure(s) performed: None  Procedures  Critical Care performed: No  ____________________________________________   INITIAL IMPRESSION / ASSESSMENT AND PLAN / ED COURSE  As part of my medical decision making, I reviewed the following data within the electronic MEDICAL RECORD NUMBER    Right ankle pain secondary to sprain.  Discussed x-ray findings with patient.  Patient placed in ankle stirrup splint and given discharge care instructions.  Patient [facility].      ____________________________________________   FINAL CLINICAL IMPRESSION(S) / ED DIAGNOSES  Final diagnoses:  Sprain of right ankle, unspecified ligament, initial encounter     ED Discharge Orders        Ordered    naproxen (NAPROSYN) 500 MG tablet  2 times daily with meals     01/24/17 1550       Note:  This document was prepared using Dragon voice recognition software and may include unintentional dictation errors.    Joni Reining, PA-C 01/24/17 1553    Arnaldo Natal, MD 01/24/17 [phone]

## 2017-01-24 NOTE — ED Triage Notes (Signed)
Pt states she slipped and fell on the steps yesterday injuring her left ankle

## 2018-03-03 ENCOUNTER — Encounter: Payer: Self-pay | Admitting: Emergency Medicine

## 2018-03-03 ENCOUNTER — Emergency Department
Admission: EM | Admit: 2018-03-03 | Discharge: 2018-03-03 | Disposition: A | Discharge status: Home / Self Care | Payer: Self-pay | Attending: Emergency Medicine | Admitting: Emergency Medicine

## 2018-03-03 ENCOUNTER — Other Ambulatory Visit: Payer: Self-pay

## 2018-03-03 DIAGNOSIS — R109 Unspecified abdominal pain: Secondary | ICD-10-CM | POA: Insufficient documentation

## 2018-03-03 DIAGNOSIS — R197 Diarrhea, unspecified: Secondary | ICD-10-CM | POA: Insufficient documentation

## 2018-03-03 DIAGNOSIS — F17210 Nicotine dependence, cigarettes, uncomplicated: Secondary | ICD-10-CM | POA: Insufficient documentation

## 2018-03-03 LAB — URINALYSIS, COMPLETE (UACMP) WITH MICROSCOPIC
BACTERIA UA: NONE SEEN
Bilirubin Urine: NEGATIVE
Glucose, UA: NEGATIVE mg/dL
KETONES UR: NEGATIVE mg/dL
Leukocytes,Ua: NEGATIVE
Nitrite: NEGATIVE
PH: 5 (ref 5.0–8.0)
Protein, ur: NEGATIVE mg/dL
Specific Gravity, Urine: 1.021 (ref 1.005–1.030)

## 2018-03-03 LAB — URINE DRUG SCREEN, QUALITATIVE (ARMC ONLY)
AMPHETAMINES, UR SCREEN: NOT DETECTED
Barbiturates, Ur Screen: NOT DETECTED
Benzodiazepine, Ur Scrn: NOT DETECTED
COCAINE METABOLITE, UR ~~LOC~~: NOT DETECTED
Cannabinoid 50 Ng, Ur ~~LOC~~: POSITIVE — AB
MDMA (ECSTASY) UR SCREEN: NOT DETECTED
METHADONE SCREEN, URINE: NOT DETECTED
Opiate, Ur Screen: NOT DETECTED
Phencyclidine (PCP) Ur S: NOT DETECTED
TRICYCLIC, UR SCREEN: NOT DETECTED

## 2018-03-03 LAB — GASTROINTESTINAL PANEL BY PCR, STOOL (REPLACES STOOL CULTURE)
Adenovirus F40/41: NOT DETECTED
Astrovirus: NOT DETECTED
CAMPYLOBACTER SPECIES: NOT DETECTED
Cryptosporidium: NOT DETECTED
Cyclospora cayetanensis: NOT DETECTED
ENTAMOEBA HISTOLYTICA: NOT DETECTED
ENTEROTOXIGENIC E COLI (ETEC): NOT DETECTED
Enteroaggregative E coli (EAEC): NOT DETECTED
Enteropathogenic E coli (EPEC): NOT DETECTED
Giardia lamblia: NOT DETECTED
NOROVIRUS GI/GII: NOT DETECTED
PLESIMONAS SHIGELLOIDES: NOT DETECTED
ROTAVIRUS A: NOT DETECTED
SALMONELLA SPECIES: NOT DETECTED
SAPOVIRUS (I, II, IV, AND V): NOT DETECTED
SHIGA LIKE TOXIN PRODUCING E COLI (STEC): NOT DETECTED
SHIGELLA/ENTEROINVASIVE E COLI (EIEC): NOT DETECTED
VIBRIO CHOLERAE: NOT DETECTED
Vibrio species: NOT DETECTED
Yersinia enterocolitica: NOT DETECTED

## 2018-03-03 LAB — COMPREHENSIVE METABOLIC PANEL
ALBUMIN: 4.2 g/dL (ref 3.5–5.0)
ALK PHOS: 42 U/L (ref 38–126)
ALT: 12 U/L (ref 0–44)
AST: 13 U/L — AB (ref 15–41)
Anion gap: 7 (ref 5–15)
BUN: 13 mg/dL (ref 6–20)
CALCIUM: 8.9 mg/dL (ref 8.9–10.3)
CO2: 20 mmol/L — AB (ref 22–32)
CREATININE: 0.72 mg/dL (ref 0.44–1.00)
Chloride: 109 mmol/L (ref 98–111)
GFR calc Af Amer: >60 mL/min (ref >60)
GFR calc non Af Amer: >60 mL/min (ref >60)
GLUCOSE: 113 mg/dL — AB (ref 70–99)
Potassium: 3.9 mmol/L (ref 3.5–5.1)
SODIUM: 136 mmol/L (ref 135–145)
Total Bilirubin: 0.7 mg/dL (ref 0.3–1.2)
Total Protein: 7 g/dL (ref 6.5–8.1)

## 2018-03-03 LAB — CBC
HEMATOCRIT: 44.2 % (ref 36.0–46.0)
Hemoglobin: 14.8 g/dL (ref 12.0–15.0)
MCH: 31.6 pg (ref 26.0–34.0)
MCHC: 33.5 g/dL (ref 30.0–36.0)
MCV: 94.2 fL (ref 80.0–100.0)
PLATELETS: 286 10*3/uL (ref 150–400)
RBC: 4.69 MIL/uL (ref 3.87–5.11)
RDW: 12.2 % (ref 11.5–15.5)
WBC: 13 10*3/uL — ABNORMAL HIGH (ref 4.0–10.5)
nRBC: 0 % (ref 0.0–0.2)

## 2018-03-03 LAB — LIPASE, BLOOD: Lipase: 24 U/L (ref 11–51)

## 2018-03-03 MED ORDER — SODIUM CHLORIDE 0.9 % IV BOLUS
1000.0000 mL | Freq: Once | INTRAVENOUS | Status: AC
  Administered 2018-03-03: 1000 mL via INTRAVENOUS

## 2018-03-03 MED ORDER — SODIUM CHLORIDE 0.9% FLUSH: 3.0000 mL | Freq: Once | INTRAVENOUS | Status: DC

## 2018-03-03 MED ORDER — FENTANYL CITRATE (PF) 100 MCG/2ML IJ SOLN: 50.0000 ug | Freq: Once | INTRAMUSCULAR | Status: DC

## 2018-03-03 MED ORDER — SODIUM CHLORIDE 0.9 % IV SOLN: 500.0000 mg | Freq: Once | INTRAVENOUS | Status: DC

## 2018-03-03 MED ORDER — ONDANSETRON HCL 4 MG/2ML IJ SOLN
4.0000 mg | Freq: Once | INTRAMUSCULAR | Status: AC
  Administered 2018-03-03: 4 mg via INTRAVENOUS
  Filled 2018-03-03: qty 2

## 2018-03-03 MED ORDER — ACETAMINOPHEN 325 MG PO TABS
650.0000 mg | ORAL_TABLET | Freq: Once | ORAL | Status: AC
  Administered 2018-03-03: 650 mg via ORAL
  Filled 2018-03-03: qty 2

## 2018-03-03 NOTE — ED Notes (Signed)
Pt left AMA. Pt reports that she "was done" and "leaving the hospital." Pt was seen leaving the ED with her mother. Alphonzo Lemmings, MD made aware.

## 2018-03-03 NOTE — ED Provider Notes (Addendum)
Very difficult to tease out because she is still chronically complaining of abdominal pain and emergency departments.[facility] Emergency Department Provider Note  ____________________________________________   I have reviewed the triage vital signs and the nursing notes. Where available I have reviewed prior notes and, if possible and indicated, outside hospital notes.    HISTORY  Chief Complaint Vomiting    HPI Vanessa Simpson is a 48 y.o. female with a history of chronic abdominal pain chronic recurrent nausea chronic recurrent diarrhea, very large lifetime burden of CT scans, last time she was here I calculated she had 20, presents today complaining of abdominal cramping and loose stools with blood.  This began this morning.  Vomited x1.  No fever no chills.  No focal abdominal pain no lower abdominal pain.  Has had endoscopy.  No recent travel no recent antibiotics.  Not on any blood thinners does not routinely take NSAIDs, no prior history of GI bleed, no hematemesis.  No melena.  No "diarrhea" but she has had "loose stools today" with some blood in excess mixed in.  Does not have a history of IBD.  No sick contacts.  Not lightheaded.    Past Medical History:  Diagnosis Date  . GERD (gastroesophageal reflux disease)   . Kidney stones     There are no active problems to display for this patient.   Past Surgical History:  Procedure Laterality Date  . ABDOMINAL HYSTERECTOMY      Prior to Admission medications   Medication Sig Start Date End Date Taking? Authorizing Provider  naproxen (NAPROSYN) 500 MG tablet Take 1 tablet (500 mg total) by mouth 2 (two) times daily with a meal. 01/24/17   Joni Reining, PA-C  ondansetron (ZOFRAN) 4 MG tablet Take 1 tablet (4 mg total) by mouth every 8 (eight) hours as needed for nausea or vomiting. 09/30/15   Jeanmarie Plant, MD  pantoprazole (PROTONIX) 40 MG tablet Take 1 tablet (40 mg total) by mouth 2 (two) times  daily. 07/16/14 07/16/14  Darci Current, MD    Allergies Codeine; Flagyl [metronidazole]; and Sulfa antibiotics  No family history on file.  Social History Social History   Tobacco Use  . Smoking status: Current Every Day Smoker    Packs/day: 1.00    Types: Cigarettes  . Smokeless tobacco: Never Used  Substance Use Topics  . Alcohol use: No  . Drug use: No    Review of Systems Constitutional: No fever/chills Eyes: No visual changes. ENT: No sore throat. No stiff neck no neck pain Cardiovascular: Denies chest pain. Respiratory: Denies shortness of breath. Gastrointestinal:   no vomiting.  No diarrhea.  No constipation. Genitourinary: Negative for dysuria. Musculoskeletal: Negative lower extremity swelling Skin: Negative for rash. Neurological: Negative for severe headaches, focal weakness or numbness.   ____________________________________________   PHYSICAL EXAM:  VITAL SIGNS: ED Triage Vitals [03/03/18 1443]  Enc Vitals Group     BP (!) 137/92     Pulse Rate 69     Resp 14     Temp 98.6 F (37 C)     Temp Source Oral     SpO2 98 %     Weight      Height      Head Circumference      Peak Flow      Pain Score 7     Pain Loc      Pain Edu?      Excl. in GC?  Constitutional: Alert and oriented. Well appearing and in no acute distress.  Anxious Eyes: Conjunctivae are normal Head: Atraumatic HEENT: No congestion/rhinnorhea. Mucous membranes are moist.  Oropharynx non-erythematous Neck:   Nontender with no meningismus, no masses, no stridor Cardiovascular: Normal rate, regular rhythm. Grossly normal heart sounds.  Good peripheral circulation. Respiratory: Normal respiratory effort.  No retractions. Lungs CTAB. Abdominal: Soft and slight discomfort but no focal pain or tenderness. No distention. No guarding no rebound Back:  There is no focal tenderness or step off.  there is no midline tenderness there are no lesions noted. there is no CVA  tenderness Musculoskeletal: No lower extremity tenderness, no upper extremity tenderness. No joint effusions, no DVT signs strong distal pulses no edema Neurologic:  Normal speech and language. No gross focal neurologic deficits are appreciated.  Skin:  Skin is warm, dry and intact. No rash noted. Psychiatric: Mood and affect are somewhat anxous. Speech and behavior are normal.  ____________________________________________   LABS (all labs ordered are listed, but only abnormal results are displayed)  Labs Reviewed  COMPREHENSIVE METABOLIC PANEL - Abnormal; Notable for the following components:      Result Value   CO2 20 (*)    Glucose, Bld 113 (*)    AST 13 (*)    All other components within normal limits  CBC - Abnormal; Notable for the following components:   WBC 13.0 (*)    All other components within normal limits  GASTROINTESTINAL PANEL BY PCR, STOOL (REPLACES STOOL CULTURE)  LIPASE, BLOOD  URINALYSIS, COMPLETE (UACMP) WITH MICROSCOPIC    Pertinent labs  results that were available during my care of the patient were reviewed by me and considered in my medical decision making (see chart for details). ____________________________________________  EKG  I personally interpreted any EKGs ordered by me or triage  ____________________________________________  RADIOLOGY  Pertinent labs & imaging results that were available during my care of the patient were reviewed by me and considered in my medical decision making (see chart for details). If possible, patient and/or family made aware of any abnormal findings.  No results found. ____________________________________________    PROCEDURES  Procedure(s) performed: None  Procedures  Critical Care performed: None  ____________________________________________   INITIAL IMPRESSION / ASSESSMENT AND PLAN / ED COURSE  Pertinent labs & imaging results that were available during my care of the patient were reviewed by me and  considered in my medical decision making (see chart for details).  Patient here with bloody stool.  2 small stools today, I have seen 1 of them, the patient has a scant amount of bright red blood in the toilet which does appear to be contaminated with mucus as well.  We will send her for stool culture if she can produce a different sample.  Hemoglobin is fine vital signs are fine, blood work is otherwise unremarkable abdomen is not surgical nothing to suggest diverticulitis we will try to avoid CT scan in this patient given her history of chronic recurrent CT scans given the risk of cancer, I have low suspicion for acute surgical pathology today.  We will see if she can give [place] a better sample as I suspect this could be infectious.  She may also have some degree of IBD, although she states she has had colonoscopies in the past she has not had one recently.  Certainly no evidence of intravascular depletion from these events.  She is not on any blood thinners nor does she have a history of taking  significant NSAIDs etc. and there is been no melena she describes it.  ----------------------------------------- 10:04 PM on 03/03/2018 -----------------------------------------  Has had 2 small bowel movements with blood in them since she has been here.  She is repeatedly demanding narcotic pain medication, her abdomen is non-focally tender, I will obtain CT because of persistent pain, we will give her pain medication although again this is somewhat difficult to tease out because of the chronic pain the patient suffers from.  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  Unfortunately, patient is also had as noted more than 20 CT scans in her life of her abdomen and pelvis.  Personally reviewed a large number of them and there is a different number which are negative for any acute pathology.  Occasionally she does have kidney stones with most of the time they are in the kidneys and the ones that I have reviewed.  At this time, she is angry states she is angry That I am not immediately giving her narcotics and she is demanding discharge.  Expressed to her that I had ordered narcotics and imaging and again if  her blood mixed stool is causing her such discomfort that she requires IV narcotics, which is atypical, then I feel that imaging is indicated.  She now has decided she does not want the imaging she wants to just "go home" she does know anything else done.  I will send her home with antibiotics and return precautions and follow-up.  We talked about admission but she is adamant that she just wants to "just leave".  Family are in the room with her for this.  Patient's very concerning history of CT scans is something that she does not want to talk about and she and her family are angry that I brought it up.  Recently we are just trying to do what is best for the patient but if she is in such discomfort that she requires escalating pain medication, I did suggest CT scan  ----------------------------------------- 10:12 PM on 03/03/2018 ----------------------------------------- I was informed that the patient and her mother stood up and left the room without waiting for discharge.  Therefore, I cannot give him antibiotics.  Patient therefore has elected to elope from the department.    ____________________________________________   FINAL CLINICAL IMPRESSION(S) / ED DIAGNOSES  Final  diagnoses:  None      This chart was dictated using voice recognition software.  Despite best efforts to proofread,  errors can occur which can change meaning.      Jeanmarie PlantMcShane,  A, MD 03/03/18 Norberta [id number]    Jeanmarie PlantMcShane,  A, MD 03/03/18 2205    Jeanmarie PlantMcShane,  A, MD 03/03/18 2206    Jeanmarie PlantMcShane,  A, MD 03/03/18 2214

## 2018-03-03 NOTE — ED Triage Notes (Signed)
PT c/o vomiting and diarrhea starting today. Pt states last episode of stool was bright red blood. VSS

## 2018-03-03 NOTE — ED Notes (Signed)
Pt reports that she went to the restroom in lobby and had some bright red blood in stool again. Pts VS stable. No acute distress noted.

## 2019-12-31 ENCOUNTER — Ambulatory Visit (INDEPENDENT_AMBULATORY_CARE_PROVIDER_SITE_OTHER): Payer: Self-pay | Admitting: Family Medicine

## 2019-12-31 ENCOUNTER — Encounter: Payer: Self-pay | Admitting: Family Medicine

## 2019-12-31 ENCOUNTER — Other Ambulatory Visit: Payer: Self-pay

## 2019-12-31 VITALS — BP 123/84 | HR 80 | Temp 98.4°F | Ht 65.0 in | Wt 194.0 lb

## 2019-12-31 DIAGNOSIS — N3281 Overactive bladder: Secondary | ICD-10-CM

## 2019-12-31 DIAGNOSIS — F411 Generalized anxiety disorder: Secondary | ICD-10-CM

## 2019-12-31 DIAGNOSIS — Z720 Tobacco use: Secondary | ICD-10-CM

## 2019-12-31 DIAGNOSIS — R45851 Suicidal ideations: Secondary | ICD-10-CM

## 2019-12-31 DIAGNOSIS — F331 Major depressive disorder, recurrent, moderate: Secondary | ICD-10-CM

## 2019-12-31 DIAGNOSIS — K5904 Chronic idiopathic constipation: Secondary | ICD-10-CM

## 2019-12-31 DIAGNOSIS — Z6832 Body mass index (BMI) 32.0-32.9, adult: Secondary | ICD-10-CM | POA: Insufficient documentation

## 2019-12-31 DIAGNOSIS — N951 Menopausal and female climacteric states: Secondary | ICD-10-CM

## 2019-12-31 DIAGNOSIS — E669 Obesity, unspecified: Secondary | ICD-10-CM

## 2019-12-31 MED ORDER — OXYBUTYNIN CHLORIDE ER 10 MG PO TB24: 10.0000 mg | ORAL_TABLET | Freq: Every day | ORAL | 3 refills | Status: DC

## 2019-12-31 MED ORDER — PAROXETINE HCL 20 MG PO TABS: 20.0000 mg | ORAL_TABLET | Freq: Every day | ORAL | 3 refills | Status: DC

## 2019-12-31 NOTE — Progress Notes (Signed)
New patient visit   Patient: Vanessa Simpson   DOB: 03/14/1970   49 y.o. Female  MRN: [id number] Visit Date: 12/31/2019  Today's healthcare provider: Shirlee LatchAngela Faithanne Verret, MD   Chief Complaint  Patient presents with  . Establish Care  . Depression  . Menopause   Subjective    Vanessa FruitShannon N Simpson is a 49 y.o. female who presents today as a new patient to establish care.  HPI   Constipation with bloating. Tried mag citrate with no relief. Ongoing for many years. Straining and hard stools. BM q 2-3 d  Started having hot flashes and night sweats with insomnia x1 yr.  Nocturia x5-6.  Tried OTC menopausal supplement - estrovan with no relief.  Feeling anxious and unable to fall asleep.  Her dad passed away in January 2021.  She is having trouble today as she had been in our office  Past Medical History:  Diagnosis Date  . GERD (gastroesophageal reflux disease)   . Kidney stones    Past Surgical History:  Procedure Laterality Date  . ABDOMINAL HYSTERECTOMY  2003   with tubes and R ovary removal  . BREAST REDUCTION SURGERY    . LITHOTRIPSY    . PILONIDAL CYST EXCISION     Family Status  Relation Name Status  . Mother  Alive  . Father  Deceased  . Sister  Alive  . MGM  Deceased  . MGF  Deceased  . PGM  Deceased  . PGF  Deceased   Family History  Problem Relation Age of Onset  . Hyperlipidemia Mother   . Breast cancer Mother        In her 160's  . Colon cancer Mother        In her 1160's  . Heart attack Father   . Kidney disease Father   . Heart disease Father   . Hypertension Father   . Hyperlipidemia Father   . Healthy Sister   . Lung cancer Maternal Grandfather        smoker  . Heart disease Paternal Grandfather   . Cirrhosis Paternal Grandfather    Social History   Socioeconomic History  . Marital status: Single    Spouse name: Not on file  . Number of children: 0  . Years of education: Not on file  . Highest education level: Not on file  Occupational  History  . Occupation: Financial traderhouse cleaner  Tobacco Use  . Smoking status: Current Every Day Smoker    Packs/day: 1.00    Years: 26.00    Pack years: 26.00    Types: Cigarettes  . Smokeless tobacco: Never Used  Vaping Use  . Vaping Use: Never used  Substance and Sexual Activity  . Alcohol use: No    Comment: very rare  . Drug use: Not Currently    Types: Marijuana  . Sexual activity: Not Currently    Birth control/protection: Surgical  Other Topics Concern  . Not on file  Social History Narrative  . Not on file   Social Determinants of Health   Financial Resource Strain: Not on file  Food Insecurity: Not on file  Transportation Needs: Not on file  Physical Activity: Not on file  Stress: Not on file  Social Connections: Not on file   Outpatient Medications Prior to Visit  Medication Sig  . Multiple Vitamin (MULTIVITAMIN) tablet Take 1 tablet by mouth daily.  . [DISCONTINUED] naproxen (NAPROSYN) 500 MG tablet Take 1 tablet (500 mg total) by mouth  2 (two) times daily with a meal.  . [DISCONTINUED] ondansetron (ZOFRAN) 4 MG tablet Take 1 tablet (4 mg total) by mouth every 8 (eight) hours as needed for nausea or vomiting.   No facility-administered medications prior to visit.   Allergies  Allergen Reactions  . Codeine Hives  . Flagyl [Metronidazole] Nausea Only  . Sulfa Antibiotics Hives    Immunization History  Administered Date(s) Administered  . Moderna Sars-Covid-2 Vaccination 02/26/2019, 03/16/2019  . Tdap 12/12/2009    Health Maintenance  Topic Date Due  . Hepatitis C Screening  Never done  . HIV Screening  Never done  . PAP SMEAR-Modifier  Never done  . COVID-19 Vaccine (2 - Moderna 2-dose series) 04/13/2019  . TETANUS/TDAP  12/13/2019  . INFLUENZA VACCINE  04/14/2020 (Originally 08/16/2019)    Patient Care Team: Erasmo Downer, MD as PCP - General (Family Medicine)  Review of Systems  Constitutional: Positive for diaphoresis and fatigue. Negative  for activity change, appetite change, chills, fever and unexpected weight change.  HENT: Negative.   Eyes: Negative.   Respiratory: Negative.   Cardiovascular: Negative.   Gastrointestinal: Positive for constipation. Negative for abdominal distention, abdominal pain, anal bleeding, blood in stool, diarrhea, nausea, rectal pain and vomiting.  Endocrine: Negative.   Genitourinary: Positive for frequency. Negative for decreased urine volume, difficulty urinating, dyspareunia, dysuria, enuresis, flank pain, genital sores, hematuria, menstrual problem, pelvic pain, urgency, vaginal bleeding, vaginal discharge and vaginal pain.  Musculoskeletal: Negative.   Skin: Negative.   Allergic/Immunologic: Negative.   Neurological: Negative.   Hematological: Negative.   Psychiatric/Behavioral: Negative.       Objective    BP 123/84 (BP Location: Right Arm, Patient Position: Sitting, Cuff Size: Large)   Pulse 80   Temp 98.4 F (36.9 C) (Oral)   Ht 5\' 5"  (1.651 m)   Wt 194 lb (88 kg)   BMI 32.28 kg/m     Physical Exam Vitals reviewed.  Constitutional:      General: She is not in acute distress.    Appearance: Normal appearance. She is well-developed. She is not diaphoretic.  HENT:     Head: Normocephalic and atraumatic.     Right Ear: Tympanic membrane, ear canal and external ear normal.     Left Ear: Tympanic membrane, ear canal and external ear normal.  Eyes:     General: No scleral icterus.    Conjunctiva/sclera: Conjunctivae normal.     Pupils: Pupils are equal, round, and reactive to light.  Neck:     Thyroid: No thyromegaly.  Cardiovascular:     Rate and Rhythm: Normal rate and regular rhythm.     Pulses: Normal pulses.     Heart sounds: Normal heart sounds. No murmur heard.   Pulmonary:     Effort: Pulmonary effort is normal. No respiratory distress.     Breath sounds: Normal breath sounds. No wheezing or rales.  Abdominal:     General: There is no distension.      Palpations: Abdomen is soft.     Tenderness: There is no abdominal tenderness.  Musculoskeletal:        General: No deformity.     Cervical back: Neck supple.     Right lower leg: No edema.     Left lower leg: No edema.  Lymphadenopathy:     Cervical: No cervical adenopathy.  Skin:    General: Skin is warm and dry.     Findings: No rash.  Neurological:  Mental Status: She is alert and oriented to person, place, and time. Mental status is at baseline.     Sensory: No sensory deficit.     Motor: No weakness.     Gait: Gait normal.  Psychiatric:        Mood and Affect: Mood normal.        Behavior: Behavior normal.        Thought Content: Thought content normal.      Depression Screen PHQ 2/9 Scores 12/31/2019  PHQ - 2 Score 5  PHQ- 9 Score 20   No results found for any visits on 12/31/19.  Assessment & Plan      Problem List Items Addressed This Visit      Cardiovascular and Mediastinum   Hot flash, menopausal    Discussed that paxil can be helpful for this Since we are using Paxil for MDD and GAD as below, we will use this for hot flashes as well.        Digestive   Chronic idiopathic constipation    Encourage daily use of miralax and titrate to 1 soft BM daily        Genitourinary   OAB (overactive bladder)    New diagnosis but ongoing for some time Discussed her nocturia x5 and that is not normal Start Ditropan qhs Avoid caffeine or other bladder irritants        Other   Class 1 obesity without serious comorbidity with body mass index (BMI) of 32.0 to 32.9 in adult    Discussed importance of healthy weight management Discussed diet and exercise       Relevant Orders   CBC   TSH   Comprehensive metabolic panel   Tobacco abuse    3-5 minute discussion regarding the harms of tobacco use, the benefits of cessation, and methods of cessation Discussed that there are medication options to help with cessation Patient is currently precontemplative   Will reassess at next visit       GAD (generalized anxiety disorder)    New diagnosis Not high anxiety levels around daily life with no specific triggers Will Start Paxil 20 mg daily Discussed potential side effects, incl GI upset, sexual dysfunction, and SI Discussed that it can take 6-8 weeks to reach full efficacy Contracted for safet Discussed synergistic effects of medications and therapy  Follow-up in 2 months and repeat PHQ-9 and GAD-7 and consider dose titration at that time      Relevant Medications   PARoxetine (PAXIL) 20 MG tablet   Other Relevant Orders   CBC   TSH   Comprehensive metabolic panel   Moderate episode of recurrent major depressive disorder (HCC) - Primary    Worsened by grief recently, but preexist that Significant anhedonia, sleep disturbance, depressed mood Passive SI as below Will Start Paxil 20 mg daily Discussed potential side effects, incl GI upset, sexual dysfunction, and SI Discussed that it can take 6-8 weeks to reach full efficacy Contracted for safety Discussed synergistic effects of medications and therapy  Follow-up in 2 months and repeat PHQ-9 and GAD-7      Relevant Medications   PARoxetine (PAXIL) 20 MG tablet   Other Relevant Orders   CBC   TSH   Comprehensive metabolic panel   Passive suicidal ideations    Related to depression as above No active thoughts Contracted for safety Continue to monitor          Return in about 3 months (around 03/30/2020) for MDD/GAD  f/u.     I, Shirlee Latch, MD, have reviewed all documentation for this visit. The documentation on 01/01/20 for the exam, diagnosis, procedures, and orders are all accurate and complete.   Arne Schlender, Marzella Schlein, MD, MPH [facility]

## 2019-12-31 NOTE — Patient Instructions (Signed)

## 2020-01-01 DIAGNOSIS — R45851 Suicidal ideations: Secondary | ICD-10-CM | POA: Insufficient documentation

## 2020-01-01 DIAGNOSIS — N3281 Overactive bladder: Secondary | ICD-10-CM | POA: Insufficient documentation

## 2020-01-01 DIAGNOSIS — N951 Menopausal and female climacteric states: Secondary | ICD-10-CM | POA: Insufficient documentation

## 2020-01-01 DIAGNOSIS — F411 Generalized anxiety disorder: Secondary | ICD-10-CM | POA: Insufficient documentation

## 2020-01-01 DIAGNOSIS — K5904 Chronic idiopathic constipation: Secondary | ICD-10-CM | POA: Insufficient documentation

## 2020-01-01 DIAGNOSIS — F331 Major depressive disorder, recurrent, moderate: Secondary | ICD-10-CM | POA: Insufficient documentation

## 2020-01-01 NOTE — Assessment & Plan Note (Signed)
Discussed importance of healthy weight management Discussed diet and exercise  

## 2020-01-01 NOTE — Assessment & Plan Note (Signed)
Encourage daily use of miralax and titrate to 1 soft BM daily

## 2020-01-01 NOTE — Assessment & Plan Note (Signed)
3-5 minute discussion regarding the harms of tobacco use, the benefits of cessation, and methods of cessation Discussed that there are medication options to help with cessation Patient is currently precontemplative  Will reassess at next visit  

## 2020-01-01 NOTE — Assessment & Plan Note (Signed)
New diagnosis Not high anxiety levels around daily life with no specific triggers Will Start Paxil 20 mg daily Discussed potential side effects, incl GI upset, sexual dysfunction, and SI Discussed that it can take 6-8 weeks to reach full efficacy Contracted for safet Discussed synergistic effects of medications and therapy  Follow-up in 2 months and repeat PHQ-9 and GAD-7 and consider dose titration at that time

## 2020-01-01 NOTE — Assessment & Plan Note (Signed)
New diagnosis but ongoing for some time Discussed her nocturia x5 and that is not normal Start Ditropan qhs Avoid caffeine or other bladder irritants

## 2020-01-01 NOTE — Assessment & Plan Note (Signed)
Related to depression as above No active thoughts Contracted for safety Continue to monitor

## 2020-01-01 NOTE — Assessment & Plan Note (Signed)
Discussed that paxil can be helpful for this Since we are using Paxil for MDD and GAD as below, we will use this for hot flashes as well.

## 2020-01-01 NOTE — Assessment & Plan Note (Signed)
Worsened by grief recently, but preexist that Significant anhedonia, sleep disturbance, depressed mood Passive SI as below Will Start Paxil 20 mg daily Discussed potential side effects, incl GI upset, sexual dysfunction, and SI Discussed that it can take 6-8 weeks to reach full efficacy Contracted for safety Discussed synergistic effects of medications and therapy  Follow-up in 2 months and repeat PHQ-9 and GAD-7

## 2020-02-25 LAB — COMPREHENSIVE METABOLIC PANEL
ALT: 15 IU/L (ref 0–32)
AST: 13 IU/L (ref 0–40)
Albumin/Globulin Ratio: 2 (ref 1.2–2.2)
Albumin: 4.7 g/dL (ref 3.8–4.8)
Alkaline Phosphatase: 62 IU/L (ref 44–121)
BUN/Creatinine Ratio: 16 (ref 9–23)
BUN: 15 mg/dL (ref 6–24)
Bilirubin Total: 0.3 mg/dL (ref 0.0–1.2)
CO2: 22 mmol/L (ref 20–29)
Calcium: 10.2 mg/dL (ref 8.7–10.2)
Chloride: 102 mmol/L (ref 96–106)
Creatinine, Ser: 0.96 mg/dL (ref 0.57–1.00)
GFR calc Af Amer: 81 mL/min/{1.73_m2} (ref >59)
GFR calc non Af Amer: 71 mL/min/{1.73_m2} (ref >59)
Globulin, Total: 2.3 g/dL (ref 1.5–4.5)
Glucose: 92 mg/dL (ref 65–99)
Potassium: 4.1 mmol/L (ref 3.5–5.2)
Sodium: 140 mmol/L (ref 134–144)
Total Protein: 7 g/dL (ref 6.0–8.5)

## 2020-02-25 LAB — CBC
Hematocrit: 41.4 % (ref 34.0–46.6)
Hemoglobin: 14.2 g/dL (ref 11.1–15.9)
MCH: 31.5 pg (ref 26.6–33.0)
MCHC: 34.3 g/dL (ref 31.5–35.7)
MCV: 92 fL (ref 79–97)
Platelets: 281 10*3/uL (ref 150–450)
RBC: 4.51 x10E6/uL (ref 3.77–5.28)
RDW: 12 % (ref 11.7–15.4)
WBC: 11.8 10*3/uL — ABNORMAL HIGH (ref 3.4–10.8)

## 2020-02-25 LAB — TSH: TSH: 1.03 u[IU]/mL (ref 0.450–4.500)

## 2020-03-24 NOTE — Progress Notes (Signed)
Established patient visit   Patient: Vanessa Simpson   DOB: [birth year]   50 y.o. Female  MRN: [id number] Visit Date: 03/25/2020  Today's healthcare provider: Shirlee Latch, MD   Chief Complaint  Patient presents with  . Depression  . Anxiety  . Over Active Bladder  . Back Pain   Subjective    HPI HPI    Back Pain    This is a new problem.  There was not an injury that may have caused the pain.  Recent episode started in the past 7 days.  The problem has been waxing and waning since onset.  Pain is thoracic spine.  Radiating to: Right arm.  Severity of the pain is moderate.  Pain occurs intermittently.  Aggravating factors: movement.       Last edited by Myles Lipps, CMA on 03/25/2020  8:21 AM. (History)      Depression, Follow-up  She  was last seen for this 3 months ago. Changes made at last visit include START PAXIL 20 mg daily.   She reports excellent compliance with treatment. She is not having side effects.  She reports excellent tolerance of treatment. Current symptoms include: denies any depression symptoms She feels she is Improved since last visit.  Depression screen [facility] 2/9 03/25/2020 12/31/2019  Decreased Interest 1 2  Down, Depressed, Hopeless 1 3  PHQ - 2 Score 2 5  Altered sleeping 2 3  Tired, decreased energy 1 2  Change in appetite 1 2  Feeling bad or failure about yourself  1 2  Trouble concentrating 3 3  Moving slowly or fidgety/restless 2 2  Suicidal thoughts 0 1  PHQ-9 Score 12 20  Difficult doing work/chores Somewhat difficult Very difficult    ----------------------------------------------------------------------------------------- Anxiety, Follow-up  She was last seen for anxiety 3 months ago. Changes made at last visit include start Paxil 20 mg daily.   She reports excellent compliance with treatment. She reports excellent tolerance of treatment. She is not having side effects.   She feels her anxiety is  moderate and Unchanged since last visit.  Symptoms: No chest pain Yes difficulty concentrating  No dizziness No fatigue  No feelings of losing control Yes insomnia  Yes irritable No palpitations  No panic attacks No racing thoughts  No shortness of breath No sweating  Yes tremors/shakes    GAD-7 Results GAD-7 Generalized Anxiety Disorder Screening Tool 03/25/2020 12/31/2019  1. Feeling Nervous, Anxious, or on Edge 3 3  2. Not Being Able to Stop or Control Worrying 1 2  3. Worrying Too Much About Different Things 1 3  4. Trouble Relaxing 3 3  5. Being So Restless it's Hard To Sit Still 3 3  6. Becoming Easily Annoyed or Irritable 1 3  7. Feeling Afraid As If Something Awful Might Happen 0 2  Total GAD-7 Score 12 19  Difficulty At Work, Home, or Getting  Along With Others? Somewhat difficult Very difficult    PHQ-9 Scores PHQ9 SCORE ONLY 03/25/2020 12/31/2019  PHQ-9 Total Score 12 20    --------------------------------------------------------------------------------------------------- Follow up for over active bladder  The patient was last seen for this 3 months ago. Changes made at last visit include start oxybutynin.  She reports excellent compliance with treatment. She feels that condition is Improved. She is not having side effects.   -----------------------------------------------------------------------------------------  Patient Active Problem List   Diagnosis Date Noted  . Bursitis of right shoulder 03/25/2020  . GAD (generalized anxiety disorder) 01/01/2020  .  Moderate episode of recurrent major depressive disorder (HCC) 01/01/2020  . OAB (overactive bladder) 01/01/2020  . Hot flash, menopausal 01/01/2020  . Chronic idiopathic constipation 01/01/2020  . Class 1 obesity without serious comorbidity with body mass index (BMI) of 32.0 to 32.9 in adult 12/31/2019  . Tobacco abuse 12/31/2019   Social History   Tobacco Use  . Smoking status: Current Every Day  Smoker    Packs/day: 1.00    Years: 26.00    Pack years: 26.00    Types: Cigarettes  . Smokeless tobacco: Never Used  Vaping Use  . Vaping Use: Never used  Substance Use Topics  . Alcohol use: No    Comment: very rare  . Drug use: Not Currently    Types: Marijuana   Allergies  Allergen Reactions  . Codeine Hives  . Flagyl [Metronidazole] Nausea Only  . Sulfa Antibiotics Hives       Medications: Outpatient Medications Prior to Visit  Medication Sig  . Multiple Vitamin (MULTIVITAMIN) tablet Take 1 tablet by mouth daily.  . [DISCONTINUED] oxybutynin (DITROPAN XL) 10 MG 24 hr tablet Take 1 tablet (10 mg total) by mouth at bedtime.  . [DISCONTINUED] PARoxetine (PAXIL) 20 MG tablet Take 1 tablet (20 mg total) by mouth daily.   No facility-administered medications prior to visit.    Review of Systems  Constitutional: Negative for activity change, appetite change, diaphoresis, fatigue and fever.  Respiratory: Negative for chest tightness and shortness of breath.   Cardiovascular: Negative for chest pain, palpitations and leg swelling.  Genitourinary: Negative for dysuria, enuresis, frequency and urgency.  Musculoskeletal: Positive for back pain and myalgias.  Psychiatric/Behavioral: Positive for agitation and sleep disturbance. The patient is nervous/anxious.     Last CBC Lab Results  Component Value Date   WBC 11.8 (H) 02/24/2020   HGB 14.2 02/24/2020   HCT 41.4 02/24/2020   MCV 92 02/24/2020   MCH 31.5 02/24/2020   RDW 12.0 02/24/2020   PLT 281 02/24/2020   Last metabolic panel Lab Results  Component Value Date   GLUCOSE 92 02/24/2020   NA 140 02/24/2020   K 4.1 02/24/2020   CL 102 02/24/2020   CO2 22 02/24/2020   BUN 15 02/24/2020   CREATININE 0.96 02/24/2020   GFRNONAA 71 02/24/2020   GFRAA 81 02/24/2020   CALCIUM 10.2 02/24/2020   PROT 7.0 02/24/2020   ALBUMIN 4.7 02/24/2020   LABGLOB 2.3 02/24/2020   AGRATIO 2.0 02/24/2020   BILITOT 0.3 02/24/2020    ALKPHOS 62 02/24/2020   AST 13 02/24/2020   ALT 15 02/24/2020   ANIONGAP 7 03/03/2018   Last lipids No results found for: CHOL, HDL, LDLCALC, LDLDIRECT, TRIG, CHOLHDL      Objective    BP 116/76 (BP Location: Left Arm, Patient Position: Sitting, Cuff Size: Large)   Pulse 76   Temp 98.5 F (36.9 C) (Oral)   Resp 16   Ht 5\' 5"  (1.651 m)   Wt 191 lb (86.6 kg)   SpO2 98%   BMI 31.78 kg/m  BP Readings from Last 3 Encounters:  03/25/20 116/76  12/31/19 123/84  03/03/18 127/88   Wt Readings from Last 3 Encounters:  03/25/20 191 lb (86.6 kg)  12/31/19 194 lb (88 kg)  01/24/17 189 lb (85.7 kg)      Physical Exam Vitals reviewed.  Constitutional:      General: She is not in acute distress.    Appearance: Normal appearance. She is well-developed. She is not diaphoretic.  HENT:     Head: Normocephalic and atraumatic.  Eyes:     General: No scleral icterus.    Conjunctiva/sclera: Conjunctivae normal.  Neck:     Thyroid: No thyromegaly.  Cardiovascular:     Rate and Rhythm: Normal rate and regular rhythm.     Pulses: Normal pulses.     Heart sounds: Normal heart sounds. No murmur heard.   Pulmonary:     Effort: Pulmonary effort is normal. No respiratory distress.     Breath sounds: Normal breath sounds. No wheezing, rhonchi or rales.  Musculoskeletal:     Right shoulder: Tenderness (mild, subacromial) present. No swelling, deformity, effusion or laceration. Normal range of motion. Normal strength. Normal pulse.     Left shoulder: Normal.     Cervical back: Neck supple.     Right lower leg: No edema.     Left lower leg: No edema.     Comments: +Hawkins  Lymphadenopathy:     Cervical: No cervical adenopathy.  Skin:    General: Skin is warm and dry.     Findings: No rash.  Neurological:     Mental Status: She is alert and oriented to person, place, and time. Mental status is at baseline.  Psychiatric:        Mood and Affect: Affect normal. Mood is anxious.         Behavior: Behavior normal.     Comments: fidgeting       No results found for any visits on 03/25/20.  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, ice HEP given Trial of meloxicam daily - discussed GI precautions Consider subacromial corticosteroid injection if not improving        Genitourinary   OAB (overactive bladder)    Improving from nocturia x7 to 2 Increase ditropan to 15mg  qhs Avoid caffeine or other bladder irritants        Other   Tobacco abuse    Remains precontemplative Continue to assess      GAD (generalized anxiety disorder)    Longstanding and slightly improved, but feels like Paxil wears off midday Increase Paxil to 40 mg daily She is tolerating it well Contracted for safety Have discussed the synergistic effects of medications and therapy Follow-up in 3 months or sooner if needed      Relevant Medications   PARoxetine (PAXIL) 40 MG tablet   Moderate episode of recurrent major depressive disorder (HCC) - Primary    Chronic and well controlled with Paxil Increasing as above as she needs better anxiety control Contracted for safety We have discussed the synergistic effects of therapy with medications      Relevant Medications   PARoxetine (PAXIL) 40 MG tablet       Return in about 3 months (around 06/25/2020) for CPE.      I, 08/25/2020, MD, have reviewed all documentation for this visit. The documentation on 03/25/20 for the exam, diagnosis, procedures, and orders are all accurate and complete.   Reham Slabaugh, 05/25/20, MD, MPH [facility]

## 2020-03-25 ENCOUNTER — Ambulatory Visit (INDEPENDENT_AMBULATORY_CARE_PROVIDER_SITE_OTHER): Payer: Self-pay | Admitting: Family Medicine

## 2020-03-25 ENCOUNTER — Other Ambulatory Visit: Payer: Self-pay

## 2020-03-25 ENCOUNTER — Encounter: Payer: Self-pay | Admitting: Family Medicine

## 2020-03-25 VITALS — BP 116/76 | HR 76 | Temp 98.5°F | Resp 16 | Ht 65.0 in | Wt 191.0 lb

## 2020-03-25 DIAGNOSIS — M7551 Bursitis of right shoulder: Secondary | ICD-10-CM | POA: Insufficient documentation

## 2020-03-25 DIAGNOSIS — F411 Generalized anxiety disorder: Secondary | ICD-10-CM

## 2020-03-25 DIAGNOSIS — N951 Menopausal and female climacteric states: Secondary | ICD-10-CM

## 2020-03-25 DIAGNOSIS — Z720 Tobacco use: Secondary | ICD-10-CM

## 2020-03-25 DIAGNOSIS — F331 Major depressive disorder, recurrent, moderate: Secondary | ICD-10-CM

## 2020-03-25 DIAGNOSIS — N3281 Overactive bladder: Secondary | ICD-10-CM

## 2020-03-25 MED ORDER — PAROXETINE HCL 40 MG PO TABS: 40.0000 mg | ORAL_TABLET | Freq: Every day | ORAL | 3 refills | Status: DC

## 2020-03-25 MED ORDER — OXYBUTYNIN CHLORIDE ER 15 MG PO TB24: 15.0000 mg | ORAL_TABLET | Freq: Every day | ORAL | 3 refills | Status: AC

## 2020-03-25 MED ORDER — MELOXICAM 15 MG PO TABS: 15.0000 mg | ORAL_TABLET | Freq: Every day | ORAL | 0 refills | Status: AC

## 2020-03-25 NOTE — Assessment & Plan Note (Signed)
Chronic and well controlled with Paxil Increasing as above as she needs better anxiety control Contracted for safety We have discussed the synergistic effects of therapy with medications

## 2020-03-25 NOTE — Assessment & Plan Note (Signed)
Remains precontemplative Continue to assess

## 2020-03-25 NOTE — Assessment & Plan Note (Signed)
Longstanding and slightly improved, but feels like Paxil wears off midday Increase Paxil to 40 mg daily She is tolerating it well Contracted for safety Have discussed the synergistic effects of medications and therapy Follow-up in 3 months or sooner if needed

## 2020-03-25 NOTE — Assessment & Plan Note (Signed)
Improved on paxil

## 2020-03-25 NOTE — Assessment & Plan Note (Signed)
Improving from nocturia x7 to 2 Increase ditropan to 15mg  qhs Avoid caffeine or other bladder irritants

## 2020-03-25 NOTE — Assessment & Plan Note (Signed)
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

## 2020-07-22 ENCOUNTER — Telehealth: Payer: Self-pay | Admitting: Family Medicine

## 2020-07-22 MED ORDER — PAROXETINE HCL 40 MG PO TABS: 40.0000 mg | ORAL_TABLET | Freq: Every day | ORAL | 3 refills | Status: AC

## 2020-07-22 NOTE — Telephone Encounter (Signed)
   Notes to clinic:  Patient was due for follow up 06/2020 Patient states that she has to establish with a new provider because of insurance Patient would like enough until appt  on 10/08/2020   Requested Prescriptions  Pending Prescriptions Disp Refills   PARoxetine (PAXIL) 40 MG tablet 30 tablet 3    Sig: Take 1 tablet (40 mg total) by mouth daily.      Psychiatry:  Antidepressants - SSRI Passed - 07/22/2020 11:09 AM      Passed - Completed PHQ-2 or PHQ-9 in the last 360 days      Passed - Valid encounter within last 6 months    Recent Outpatient Visits           3 months ago Moderate episode of recurrent major depressive disorder [facility])   [facility] [place], Marzella Schlein, MD   6 months ago Moderate episode of recurrent major depressive disorder [facility])   [facility], Marzella Schlein, MD

## 2020-07-22 NOTE — Telephone Encounter (Signed)
Medication: PARoxetine (PAXIL) 40 MG tablet [[id number]] - Pt has to change drs due to new insurance. Her NPA 09/22  Has the patient contacted their pharmacy? YES  (Agent: If no, request that the patient contact the pharmacy for the refill.) (Agent: If yes, when and what did the pharmacy advise?)  Preferred Pharmacy (with phone number or street name): [facility] [address]), [place] - [address]) [place] [address] Phone: [phone] Fax: [phone] Hours: Not open 24 hours    Agent: Please be advised that RX refills may take up to 3 business days. We ask that you follow-up with your pharmacy.

## 2020-07-25 ENCOUNTER — Other Ambulatory Visit: Payer: Self-pay | Admitting: Family Medicine

## 2020-08-02 NOTE — Telephone Encounter (Signed)
Pt called in stating the [facility] pharmacy told her they have still not received the medication refill, pt states she is completely out of the medication, and is worried about the side effects of not taking it. Please advise.

## 2021-06-22 ENCOUNTER — Other Ambulatory Visit: Payer: Self-pay | Admitting: Family Medicine

## 2021-06-22 DIAGNOSIS — Z1231 Encounter for screening mammogram for malignant neoplasm of breast: Secondary | ICD-10-CM

## 2021-07-19 ENCOUNTER — Ambulatory Visit: Payer: Self-pay
